# Patient Record
Sex: Male | Born: 1980
Health system: Southern US, Community
[De-identification: ages and names within clinical notes are randomized; demographics above are authoritative.]

## PROBLEM LIST (undated history)

## (undated) DIAGNOSIS — G43909 Migraine, unspecified, not intractable, without status migrainosus: Secondary | ICD-10-CM

## (undated) HISTORY — DX: Migraine, unspecified, not intractable, without status migrainosus: G43.909

## (undated) HISTORY — PX: HERNIA REPAIR: SHX51

---

## 2007-09-13 ENCOUNTER — Ambulatory Visit: Payer: Self-pay | Admitting: Internal Medicine

## 2007-09-14 LAB — CONVERTED CEMR LAB
LDL Cholesterol: 113 mg/dL — ABNORMAL HIGH (ref 0–99)
Total CHOL/HDL Ratio: 4.1

## 2008-03-13 ENCOUNTER — Emergency Department (HOSPITAL_COMMUNITY): Admission: EM | Admit: 2008-03-13 | Discharge: 2008-03-13 | Payer: Self-pay | Admitting: Emergency Medicine

## 2008-03-21 ENCOUNTER — Ambulatory Visit: Payer: Self-pay | Admitting: Family Medicine

## 2009-05-20 ENCOUNTER — Ambulatory Visit: Payer: Self-pay | Admitting: Family Medicine

## 2009-05-20 DIAGNOSIS — J020 Streptococcal pharyngitis: Secondary | ICD-10-CM | POA: Insufficient documentation

## 2009-05-20 DIAGNOSIS — J069 Acute upper respiratory infection, unspecified: Secondary | ICD-10-CM | POA: Insufficient documentation

## 2009-05-20 DIAGNOSIS — R0989 Other specified symptoms and signs involving the circulatory and respiratory systems: Secondary | ICD-10-CM | POA: Insufficient documentation

## 2009-05-20 DIAGNOSIS — R0609 Other forms of dyspnea: Secondary | ICD-10-CM

## 2009-05-20 LAB — CONVERTED CEMR LAB: Rapid Strep: POSITIVE

## 2009-06-12 ENCOUNTER — Ambulatory Visit: Payer: Self-pay | Admitting: Pulmonary Disease

## 2009-09-12 ENCOUNTER — Ambulatory Visit (HOSPITAL_COMMUNITY): Admission: RE | Admit: 2009-09-12 | Discharge: 2009-09-12 | Payer: Self-pay | Admitting: Chiropractic Medicine

## 2009-11-06 ENCOUNTER — Encounter: Payer: Self-pay | Admitting: Pulmonary Disease

## 2009-11-08 ENCOUNTER — Ambulatory Visit: Payer: Self-pay | Admitting: Internal Medicine

## 2009-11-08 ENCOUNTER — Ambulatory Visit: Payer: Self-pay | Admitting: Pulmonary Disease

## 2009-11-08 DIAGNOSIS — G43009 Migraine without aura, not intractable, without status migrainosus: Secondary | ICD-10-CM | POA: Insufficient documentation

## 2009-11-08 DIAGNOSIS — G4733 Obstructive sleep apnea (adult) (pediatric): Secondary | ICD-10-CM | POA: Insufficient documentation

## 2009-11-08 DIAGNOSIS — Z9989 Dependence on other enabling machines and devices: Secondary | ICD-10-CM

## 2010-01-03 ENCOUNTER — Telehealth: Payer: Self-pay | Admitting: Internal Medicine

## 2010-01-13 ENCOUNTER — Ambulatory Visit
Admission: RE | Admit: 2010-01-13 | Discharge: 2010-01-13 | Payer: Self-pay | Source: Home / Self Care | Attending: Family Medicine | Admitting: Family Medicine

## 2010-01-14 ENCOUNTER — Ambulatory Visit: Admit: 2010-01-14 | Payer: Self-pay | Admitting: Family Medicine

## 2010-01-21 ENCOUNTER — Telehealth: Payer: Self-pay | Admitting: Internal Medicine

## 2010-02-06 NOTE — Progress Notes (Signed)
Summary: wants to change to nexium  Phone Note Call from Patient Call back at Home Phone [phone]   Caller: Patient Call For: Claris Gower MD Summary of Call: Pt is asking to change from omeprazole to nexium.  He is saying that would save him money.  Uses  outpatient pharmacy. Initial call taken by: Marty Heck CMA, AAMA,  January 21, 2010 11:53 AM  Follow-up for Phone Call        usually that is more money but I have no objection Okay to send in Rx for nexium 19m daily  1 year refills Follow-up by: RClaris GowerMD,  January 21, 2010 12:46 PM  Additional Follow-up for Phone Call Additional follow up Details #1::        Rx Called In, Spoke with patient and advised results.  Additional Follow-up by: DEdwin DadaCMA (Deborra Medina,  January 21, 2010 4:58 PM    New/Updated Medications: NEXIUM 40 MG CPDR (ESOMEPRAZOLE MAGNESIUM) take 1 by mouth once daily Prescriptions: NEXIUM 40 MG CPDR (ESOMEPRAZOLE MAGNESIUM) take 1 by mouth once daily  #90 x 3   Entered by:   DEdwin DadaCMA ([place]   Authorized by:   RClaris GowerMD   Signed by:   DEdwin DadaCMA ([place] on 01/21/2010   Method used:   Electronically to        [facility] (retail)       [address]       [place]  [address]      Ph: [phone]      Fax: [phone]  RxID:   [id number]

## 2010-02-06 NOTE — Assessment & Plan Note (Signed)
Summary: home sleep study with AHI 5/hr.   Copy to:  [facility] Primary Provider/Referring Provider:  Claris Gower MD   History of Present Illness: The pt underwent an unattended home sleep study with a type 3 device.  The flow and saturation evaluation period was at least 6 hrs and 72mn, and the pt's sleep that night was similar to his usual.  The summary data and tracings have been reviewed with the following findings.  1) the pt was found to have 12 obstructive apneas, 3 central apneas, and 18 obstructive hypopneas.  This gave him an AHI of 5/hr. 2) lowest oxygen desaturation was listed at 67%, however this appears to be a probe error.  The pt spent only 371m the entire night less than or equal to 88%.  Allergies: 1)  ! Bactrim (Sulfamethoxazole-Trimethoprim)   Impression & Recommendations:  Problem # 1:  OBSTRUCTIVE SLEEP APNEA (ICD-327.23)  very mild osa documented by home sleep study. I have discussed study with pt in detail, and explained this is not a health risk for him.  He did not have a lot of impact to his QOL, but is concerned about his snoring disrupting his bed partner's sleep.  I have reviewed with him treatment options including weight loss, surgery, dental appliance, and cpap.  He believes surgery and cpap would be overkill, and I agree.  He will consider whether to try dental appliance, and I have asked him to do some research on internet.  He will let me know if he wishes to pursue options other than weight loss.  Other Orders: Sleep Std Airflow/Heartrate and O2 SAT unattended (95806)

## 2010-02-06 NOTE — Assessment & Plan Note (Signed)
Summary: COUGH   Vital Signs:  Patient profile:   30 year old male Height:      67 inches Weight:      258.25 pounds BMI:     40.59 Temp:     98.3 degrees F oral Pulse rate:   76 / minute Pulse rhythm:   regular BP sitting:   120 / 88  (left arm) Cuff size:   large  Vitals Entered By: Sherrian Divers CMA Deborra Medina) (January 13, 2010 11:46 AM) CC: cough   History of Present Illness: 30 yo here for URI. Had a cold several weeks ago, cough persisted.  Cough is now both productive and dry - can hear rattling. mucous is typically clear, sometimes yellow.   Can hear a little wheezing with deep breath, PMH of bronchitis.  Used his proair inhaler that does help.  No fevers or chills. No other URI symptoms at this point.   Current Medications (verified): 1)  Proair Hfa 108 (90 Base) Mcg/act Aers (Albuterol Sulfate) .... 2 Puffs Up To Every 4 Hours As Needed Wheezing 2)  Omeprazole 40 Mg Cpdr (Omeprazole) .... Take One Daily By Mouth 3)  Relpax 20 Mg Tabs (Eletriptan Hydrobromide) .Marland Kitchen.. 1 At Onset of Migraine. May Repeat in 1- 2 Hours If No Relief 4)  Azithromycin 250 Mg  Tabs (Azithromycin) .... 2 By  Mouth Today and Then 1 Daily For 4 Days  Allergies: 1)  ! Bactrim (Sulfamethoxazole-Trimethoprim)  Past History:  Past Medical History: Last updated: 09/13/2007 Unremarkable  Past Surgical History: Last updated: 06/12/2009 Hernia repair as an  infant  Family History: Last updated: 11/08/2009 Mom has HTN Dad healthy 1 brother CAD in mat GF No DM No prostate or colon cancer Mom and aunt have migraines maternal grandfather with emphysema paternal grandfather and uncles with rheumatism.   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 ENT:  Denies earache, nasal  congestion, sinus pressure, and sore throat. CV:  Denies chest pain or discomfort. Resp:  Complains of cough, sputum productive, and wheezing; denies shortness of breath.  Physical Exam  General:  alert and normal appearance.   VSS Ears:  R ear normal and L ear normal.   Mouth:  no swelling/ lesions or exudate  Lungs:  Normal respiratory effort, chest expands symmetrically. Scant exp wheezes bilaterally. No crackles. Heart:  Normal rate and regular rhythm. S1 and S2 normal without gallop, murmur, click, rub or other extra sounds. Cervical Nodes:  No lymphadenopathy noted Psych:  normal affect, talkative and pleasant    Impression & Recommendations:  Problem # 1:  URI (ICD-465.9) Assessment New Given duration and progression of symptoms, will treat for bacterial bronchitis with Zpack. Proair as needed wheezing/cough.  Complete Medication List: 1)  Proair Hfa 108 (90 Base) Mcg/act Aers (Albuterol sulfate) .... 2 puffs up to every 4 hours as needed wheezing 2)  Omeprazole 40 Mg Cpdr (Omeprazole) .... Take one daily by mouth 3)  Relpax 20 Mg Tabs (Eletriptan hydrobromide) .Marland Kitchen.. 1 at onset of migraine. may repeat in 1- 2 hours if no relief 4)  Azithromycin 250 Mg Tabs (Azithromycin) .... 2 by  mouth today and then 1 daily for 4 days  Patient Instructions: 1)  Take antibiotic as directed.  Drink lots of fluids.  Treat sympotmatically with  Mucinex, nasal saline irrigation, and Tylenol/Ibuprofen. ough suppressant at night. Call if not improving as expected in 5-7 days.  Prescriptions: PROAIR HFA 108 (90 BASE) MCG/ACT AERS (ALBUTEROL SULFATE) 2 puffs up to every 4 hours as needed wheezing  #1 mdi x 0   Entered and Authorized by:   Arnette Norris MD   Signed by:   Arnette Norris MD on 01/13/2010   Method used:   [facility]        [facility] [place] [facility]       [place], [place]  [address]       Ph: [phone] or [phone]       Fax: [phone]    RxID:   [id number] AZITHROMYCIN 250 MG  TABS (AZITHROMYCIN) 2 by  mouth today and then 1 daily for 4 days  #6 x 0   Entered and Authorized by:   Arnette Norris MD   Signed by:   Arnette Norris MD on 01/13/2010   Method used:   [facility]        [facility] [facility]       [address]       [place], [place]  [address]       Ph: [phone] or [phone]       Fax: [phone]   RxID:   [id number]    Orders Added: 1)  Est. Patient Level III [73668]    Current Allergies (reviewed today): ! BACTRIM (SULFAMETHOXAZOLE-TRIMETHOPRIM)

## 2010-02-06 NOTE — Progress Notes (Signed)
Summary: wants to try relpax   Phone Note Call from Patient Call back at Home Phone [phone]   Caller: Patient Call For: Claris Gower MD Summary of Call: Patient has been taking imitrex for his migraines, but he says he doesn't like the way he feels when taking it He said the only way to describe it is that his throat feels like it has a balloon expanding inside of it. He is asking if he can try relpax.  [facility]  Initial call taken by: Lacretia Nicks,  January 03, 2010 11:55 AM  Follow-up for Phone Call        okay to try relpax may be a fair bit of money though  relpax 72m 1 at onset of migraine. May repeat in 1- 2 hours if no relief #10 x 1 Follow-up by: RClaris GowerMD,  January 03, 2010 1:28 PM  Additional Follow-up for Phone Call Additional follow up Details #1::        left message on machine at work, advised pt to call if any problems Additional Follow-up by: DEdwin DadaCMA (Deborra Medina,  January 03, 2010 2:53 PM    New/Updated Medications: RELPAX 20 MG TABS (ELETRIPTAN HYDROBROMIDE) 1 at onset of migraine. May repeat in 1- 2 hours if no relief Prescriptions: RELPAX 20 MG TABS (ELETRIPTAN HYDROBROMIDE) 1 at onset of migraine. May repeat in 1- 2 hours if no relief  #10 x 1   Entered by:   DEdwin DadaCMA ([place]   Authorized by:   RClaris GowerMD   Signed by:   DEdwin DadaCMA ([place] on 01/03/2010   Method used:   [facility]        [facility] [facility]       [address]      [place]  [address]      Ph: [phone]      Fax: [phone]  RxID:   [id number]

## 2010-02-06 NOTE — Assessment & Plan Note (Signed)
Summary: consult for possible osa   Copy to:  [facility] Primary Provider/Referring Provider:  Claris Gower MD  CC:  Sleep Consult.  History of Present Illness: the patient is a 30 year old male who I have been asked to see for possible obstructive sleep apnea. He has been noted to have loud snoring, but his wife has never complained of definite pauses in his breathing during sleep. He has had snoring arousals. He typically goes to bed between 12 and 1 AM, and arises at 9 AM to start his day. He has frequent awakenings at night for unknown reasons, and often is not rested in the mornings upon arising. He also notes morning headaches, and occasionally will awaken at night with a headache. The patient works as a Engineer, site, and denies inappropriate daytime sleepiness. However, he admits that his alertness is not at what he would consider a normal level. The patient denies any napping during the day, and actually gets his second wind in the evening. He has no difficulties watching TV or reading. He denies any sleepiness with driving. His weight is up about 20 pounds over the last 2 years, and his Epworth score today is borderline at 10  Current Medications (verified): 1)  Proair Hfa 108 (90 Base) Mcg/act Aers (Albuterol Sulfate) .... 2 Puffs Up To Every 4 Hours As Needed Wheezing 2)  Omeprazole 40 Mg Cpdr (Omeprazole) .... Take One Daily By Mouth  Allergies (verified): 1)  ! Bactrim (Sulfamethoxazole-Trimethoprim)  Past History:  Past Medical History: Reviewed history from 09/13/2007 and no changes required. Unremarkable  Past Surgical History: Hernia repair as an  infant  Family History: Reviewed history from 09/13/2007 and no changes required. Mom has HTN Dad healthy 1 brother CAD in mat GF No DM No prostate or colon cancer maternal grandfather with emphysema paternal grandfather and uncles with rheumatism.   Social History: Reviewed history from 09/13/2007 and no  changes required. 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  The patient denies shortness of breath with activity, shortness of breath at rest, productive cough, coughing up blood, chest pain, irregular heartbeats, loss of appetite, weight change, abdominal pain, difficulty swallowing, tooth/dental problems, sneezing, itching, ear ache, anxiety, depression, hand/feet swelling, rash, change in color of mucus, and fever.    Vital Signs:  Patient profile:   30 year old male Height:      67 inches Weight:      246 pounds BMI:     38.67 O2 Sat:      100 % on Room air Temp:     98.3 degrees F oral Pulse rate:   78 / minute BP sitting:   138 / 88  (right arm) Cuff size:   large  Vitals Entered By: Matthew Folks LPN (June 13, 8655 8:46 PM)  O2 Flow:  Room air CC: Sleep Consult Comments Medications reviewed with patient Matthew Folks LPN  June 13, 9627 5:28 PM    Physical Exam  General:  obese male in nad Eyes:  PERRLA and EOMI.   Nose:  mild septal deviation to right Mouth:  elongation of uvula, normal soft palate, narrowing of posterior pharyngeal space Neck:  no jvd, tmg, LN Lungs:  clear to auscultation Heart:  rrr, no  mrg Abdomen:  soft and nontender, bs+ Extremities:  no edema or cyanosis pulses intact distally Neurologic:  alert and oriented, moves all 4.   Impression & Recommendations:  Problem # 1:  SNORING (ICD-786.09) the pt's history is most suggestive of symptomatic snoring vs. mild sleep apnea.  There is really no way to tell without a sleep study.  He is young, has no major health issues, and has questionable sleep disordered breathing.  He is a good candidate for home sleep testing, and is  willing to proceed with this. I have discussed the spectrum of sleep disordered breathing with him, including asymptomatic snoring, symptomatic snoring, UARS, and finally osa.   I have encouraged him to work hard on weight loss, and suspect his snoring will greatly improve if he can get to ideal body weight.  Will call with the results of the study once available.  Other Orders: Consultation Level IV (16945) Misc. Referral (Misc. Ref)  Patient Instructions: 1)  will do home sleep study to evaluate for sleep apnea 2)  work on weight loss 3)  will call when results are available.

## 2010-02-06 NOTE — Assessment & Plan Note (Signed)
Summary: ST,FEVER/CLE   Vital Signs:  Patient profile:   30 year old male Height:      67 inches Weight:      250.25 pounds BMI:     39.34 Temp:     99.3 degrees F oral Pulse rate:   88 / minute Pulse rhythm:   regular BP sitting:   128 / 76  (left arm) Cuff size:   large  Vitals Entered By: Ozzie Hoyle LPN (May 20, 5398 86:76 PM) CC: fever sorethroat   History of Present Illness: here with strep throat today  started symptoms -- late sat night felt tickle in throat and then chills and then fever and achey  throat is really sore now -- that really started this am  no rash or joint change  no particular exposure he is prone to strep   no n/v/d some drainage and congestion-- in sinuses  not a lot of cough   is interested in sleep clinic eval  very lound snorer more headaches in am recently  is tired sometimes gets fair amt of sleep    Allergies: 1)  ! 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 Eyes:  Denies blurring and eye irritation. CV:  Denies chest pain or discomfort, lightheadness, palpitations, and shortness of breath with exertion. Resp:  Complains of cough and excessive snoring; denies sputum productive and wheezing. GI:  Denies abdominal pain, diarrhea, nausea, and vomiting. MS:  Complains of joint pain and muscle aches. Derm:  Denies itching, lesion(s), poor wound healing, and  rash. Neuro:  Denies headaches. Heme:  Denies abnormal bruising and bleeding.  Physical Exam  General:  overweight but generally well appearing  Head:  normocephalic, atraumatic, and no abnormalities observed.  no sinus or temporal tenderness Eyes:  vision grossly intact, pupils equal, pupils round, pupils reactive to light, and no injection.   Ears:  R ear normal and L ear normal.   Nose:  nares are injected and congested bilaterally  Mouth:  throat diffusely bright erythema no swelling/ lesions or exudate  Neck:  No deformities, masses, or tenderness noted. Lungs:  Normal respiratory effort, chest expands symmetrically. Lungs are clear to auscultation, no crackles or wheezes. Heart:  Normal rate and regular rhythm. S1 and S2 normal without gallop, murmur, click, rub or other extra sounds. Abdomen:  soft and non-tender.   Msk:  no acute joint changes  Extremities:  No clubbing, cyanosis, edema, or deformity noted with normal full range of motion of all joints.   Neurologic:  sensation intact to light touch, gait normal, and DTRs symmetrical and normal.   Skin:  Intact without suspicious lesions or rashes Cervical Nodes:  No lymphadenopathy noted Psych:  normal affect, talkative and pleasant    Impression & Recommendations:  Problem # 1:  STREP THROAT (ICD-034.0) Assessment New with fever and st tx with amox and update fluids/ rest / fever control  The following medications were removed from the medication list:    Zithromax Z-pak 250 Mg Tabs (Azithromycin) .Marland KitchenMarland KitchenMarland KitchenMarland Kitchen  Take by mouth as directed His updated medication list for this problem includes:    Amoxicillin 500 Mg Caps (Amoxicillin) .Marland Kitchen... 1 by mouth three times a day for 10 days for strep  Problem # 2:  URI (ICD-465.9) Assessment: New with nasal congestion and dry cough- viral  recommend sympt care- see pt instructions  pt advised to update me if symptoms worsen or do not improve - esp fever or sinus pain His updated  medication list for this problem includes:    Tussionex Pennkinetic Er 8-10 Mg/56m Lqcr (Chlorpheniramine-hydrocodone) ..Marland Kitchen.. 1/2 to 1 tsp by mouth up to two times a day as needed for cough  Problem # 3:  SNORING (ICD-786.09) Assessment: New  very loud snoring- tiredness occas and worsening am headaches suspect apnea ref to sleep clinic for this expl imp of wt loss  His updated medication list for this problem includes:    Proair Hfa 108 (90 Base) Mcg/act Aers (Albuterol sulfate) ..Marland Kitchen.. 2 puffs up to every 4 hours as needed wheezing  Orders: Sleep Disorder Referral (Sleep Disorder)  Complete Medication List: 1)  Tussionex Pennkinetic Er 8-10 Mg/567mLqcr (Chlorpheniramine-hydrocodone) .... 1/2 to 1 tsp by mouth up to two times a day as needed for cough 2)  Proair Hfa 108 (90 Base) Mcg/act Aers (Albuterol sulfate) .... 2 puffs up to every 4 hours as needed wheezing 3)  Omeprazole 40 Mg Cpdr (Omeprazole) .... Take one daily by mouth 4)  Amoxicillin 500 Mg Caps (Amoxicillin) ...Marland Kitchen 1 by mouth three times a day for 10 days for strep  Patient Instructions: 1)  take the amoxicillin for strep - I sent to your pharmacy  2)  you can try mucinex over the counter twice daily as directed and nasal saline spray for congestion 3)  tylenol over the counter as directed may help with aches, headache and fever 4)  call if symptoms worsen or if not improved in 4-5 days  5)  we will do a sleep clinic referral at check office  Prescriptions: AMOXICILLIN 500 MG CAPS (AMOXICILLIN) 1 by mouth three times a day for 10 days for strep  #30 x 0   Entered and Authorized by:   MaAllena EaringD   Signed by:   MaAllena EaringD on 05/20/2009   Method used:   [facility]        [facility] [facility]       [address]     [place]  [address]     Ph: [phone]     Fax: [phone] RxID:   [id number] Current Allergies (reviewed today): ! BACTRIM  (SULFAMETHOXAZOLE-TRIMETHOPRIM)  Laboratory Results  Date/Time Received: May 20, 2009 12:42 PM  Date/Time Reported: May 20, 2009 12:42 PM   Other Tests  Rapid Strep: positive  Kit Test Internal QC: Positive   (Normal Range: Negative)

## 2010-02-06 NOTE — Assessment & Plan Note (Signed)
Summary: migraines/alc   Vital Signs:  Patient profile:   30 year old male Weight:      252 pounds Temp:     98.5 degrees F oral Pulse rate:   60 / minute Pulse rhythm:   regular BP sitting:   128 / 98  (left arm) Cuff size:   large  Vitals Entered By: Edwin Dada CMA Deborra Medina) (November 08, 2009 11:33 AM) CC: migraines   History of Present Illness: Has occ migraines More in the past year First started  ~ age 40  Gets very nauseated --esp with noises or light generally awakens with mild symptoms--then disabled within an hour  Has had up to once a month has tried excedrin migraine or ibuprofen Not too effective  Has phenergan but this doesn't help this nausea much  Allergies: 1)  ! Bactrim (Sulfamethoxazole-Trimethoprim)  Past History:  Past medical, surgical, family and social histories (including risk factors) reviewed for relevance to current acute and chronic problems.  Past Medical History: Reviewed history from 09/13/2007 and no changes required. Unremarkable  Past Surgical History: Reviewed history from 06/12/2009 and no changes required. Hernia repair as an  infant  Family History: Mom has HTN Dad healthy 1 brother CAD in mat GF No DM No prostate or colon cancer Mom and aunt have migraines maternal grandfather with emphysema paternal grandfather and uncles with rheumatism.   Social History: Reviewed history from 06/12/2009 and no changes required. 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     Impression & Recommendations:  Problem # 1:  COMMON MIGRAINE (ICD-346.10) Assessment Comment Only  has had for at least 10 years but more difficult to control and more freq still only once a month so prophylaxis not appropriate  will give Rx for  sumatriptan to use as needed   His updated medication list for this problem includes:    Sumatriptan Succinate 100 Mg Tabs (Sumatriptan succinate) .Marland Kitchen... 1/2 - 1 tab at onset of migraine. repeat about 30 minutes later as needed  Complete Medication List: 1)  Proair Hfa 108 (90 Base) Mcg/act Aers (Albuterol sulfate) .... 2 puffs up to every 4 hours as needed wheezing 2)  Omeprazole 40 Mg Cpdr (Omeprazole) .... Take one daily by mouth 3)  Sumatriptan Succinate 100 Mg Tabs (Sumatriptan succinate) .... 1/2 - 1 tab at onset of migraine. repeat about 30 minutes later as needed  Patient Instructions: 1)  Please schedule a follow-up appointment in 1 year for physical Prescriptions: SUMATRIPTAN SUCCINATE 100 MG TABS (SUMATRIPTAN SUCCINATE) 1/2 - 1 tab at onset of migraine. Repeat about 30 minutes later as needed  #15 x 1   Entered and Authorized by:   Claris Gower MD   Signed by:   Claris Gower MD on 11/08/2009   Method used:   Electronically to        [facility] (retail)       [address], [place]  [address]       Ph: [phone]       Fax: [phone]   RxID:   [id number] OMEPRAZOLE 40 MG CPDR (OMEPRAZOLE) take one daily by mouth  #90 x 3  Entered by:   Edwin Dada CMA ([place])   Authorized by:   Claris Gower MD   Signed by:   Edwin Dada CMA ([place]) on 11/08/2009   Method used:   Electronically to        [facility] (retail)       [address]       [place], [place]  [address]       Ph: [phone]       Fax: [phone]   RxID:   [id number]    Orders Added: 1)  Est. Patient Level III [12248]    Current Allergies (reviewed today): ! BACTRIM (SULFAMETHOXAZOLE-TRIMETHOPRIM)

## 2010-02-24 ENCOUNTER — Encounter: Payer: Self-pay | Admitting: Internal Medicine

## 2010-03-13 NOTE — Letter (Signed)
Summary: External Correspondence  External Correspondence   Imported By: Laural Benes 03/03/2010 13:00:03  _____________________________________________________________________  External Attachment:    Type:   Image     Comment:   External Document  Appended Document: External Correspondence Migraine without aura trying maxalt

## 2010-04-17 LAB — POCT RAPID STREP A (OFFICE): Streptococcus, Group A Screen (Direct): NEGATIVE

## 2010-11-10 ENCOUNTER — Encounter: Payer: Self-pay | Admitting: Internal Medicine

## 2010-11-10 ENCOUNTER — Ambulatory Visit (INDEPENDENT_AMBULATORY_CARE_PROVIDER_SITE_OTHER): Payer: 59 | Admitting: Internal Medicine

## 2010-11-10 VITALS — BP 141/90 | HR 70 | Temp 98.0°F | Wt 239.0 lb

## 2010-11-10 DIAGNOSIS — J069 Acute upper respiratory infection, unspecified: Secondary | ICD-10-CM

## 2010-11-10 MED ORDER — AMOXICILLIN 500 MG PO TABS: 1000.0000 mg | ORAL_TABLET | Freq: Two times a day (BID) | ORAL | Status: AC

## 2010-11-10 MED ORDER — TRAMADOL HCL 50 MG PO TABS: 50.0000 mg | ORAL_TABLET | Freq: Every evening | ORAL | Status: AC | PRN

## 2010-11-10 NOTE — Assessment & Plan Note (Signed)
Seems to have viral infection with post infectious cough Will try tramadol for bedtime cough Amoxicillin if worsens

## 2010-11-10 NOTE — Progress Notes (Signed)
  Subjective:    Patient ID: Adam Dunn, male    DOB: Jun 18, 1980, 30 y.o.   MRN: [id number]  HPI Had a cold with the change in weather--started about 9 days ago Now with persistent dry cough after period of feeling better  No SOB No fever Very little head congestion or drainage No sore throat or ear pain  Tried delsym---helps a little Uses cough drops in day  No current outpatient prescriptions on file prior to visit.    Allergies  Allergen Reactions  . Sulfamethoxazole W/Trimethoprim     REACTION: Pt does not remember, was an infant    No past medical history on file.  Past Surgical History  Procedure Date  . Hernia repair     infant    Family History  Problem Relation Age of Onset  . Hypertension Mother   . Migraines Mother   . Healthy Father   . Coronary artery disease Maternal Grandfather   . Emphysema Maternal Grandfather   . Colon cancer Neg Hx   . Prostate cancer Neg Hx     History   Social History  . Marital Status: Married    Spouse Name: N/A    Number of Children: 1  . Years of Education: N/A   Occupational History  . RETAILER    Social History Main Topics  . Smoking status: Never Smoker   . Smokeless tobacco: Never Used  . Alcohol Use: Yes  . Drug Use: No  . Sexually Active: Not on file   Other Topics Concern  . Not on file   Social History Narrative  . No narrative on file   Review of Systems No N/V or diarrhea No problems with diarrhea No rash    Objective:   Physical Exam  Constitutional: He appears well-developed and well-nourished. No distress.       clammy  HENT:  Right Ear: External ear normal.  Left Ear: External ear normal.  Mouth/Throat: Oropharynx is clear and moist. No oropharyngeal exudate.       Mild nasal congestion on right No sinus tenderness  Neck: Normal range of motion. Neck supple. No thyromegaly present.  Pulmonary/Chest: Effort normal and breath sounds normal. No respiratory distress. He has no  wheezes. He has no rales.  Lymphadenopathy:    He has no cervical adenopathy.          Assessment & Plan:

## 2010-12-22 ENCOUNTER — Other Ambulatory Visit: Payer: Self-pay | Admitting: Internal Medicine

## 2011-08-03 ENCOUNTER — Other Ambulatory Visit: Payer: Self-pay | Admitting: Internal Medicine

## 2012-02-23 ENCOUNTER — Telehealth: Payer: Self-pay | Admitting: Family Medicine

## 2012-02-23 NOTE — Telephone Encounter (Signed)
Pt would like to see Dr. Leta Baptist, [facility], and needs referral.

## 2012-02-24 NOTE — Telephone Encounter (Signed)
Left detailed message on VM with results

## 2012-02-24 NOTE — Telephone Encounter (Signed)
If he does have fibromyalgia, a rheumatologist would be a better choice of specialist.  Most people with typical migraines can be treated with available meds without a specialist (and there are headache specialists for those who have complex problems)  I really recommend a visit her first to review all that is going on, then decide on a plan

## 2012-02-24 NOTE — Telephone Encounter (Signed)
Patient states he has migraines and maybe on the beginning stages of fibromyalgia, per pt states his wife works at [employer] and suggested this neurologist.

## 2012-02-24 NOTE — Telephone Encounter (Signed)
.  left message to have patient return my call, need more information as to why he needs the referral

## 2012-02-24 NOTE — Telephone Encounter (Signed)
Please get more information Why does he want to see a neurologist?

## 2012-06-30 ENCOUNTER — Telehealth: Payer: Self-pay | Admitting: Internal Medicine

## 2012-06-30 NOTE — Telephone Encounter (Signed)
A neurologist may be appropriate for headaches but not "fibromyalgia" I have not seen him for years, if he would like to discuss a referral, he should have appt here first

## 2012-06-30 NOTE — Telephone Encounter (Signed)
Pt says you suggested referring him to a RA dr for his fibromyalgia pain and headaches.  He says he would prefer to be referred to a neurologist and wants to know if you would refer him there first? Thank you.

## 2012-07-01 NOTE — Telephone Encounter (Signed)
Left message on phone with results, advised pt to call for appt

## 2013-02-02 ENCOUNTER — Ambulatory Visit (INDEPENDENT_AMBULATORY_CARE_PROVIDER_SITE_OTHER): Payer: 59 | Admitting: Internal Medicine

## 2013-02-02 ENCOUNTER — Encounter: Payer: Self-pay | Admitting: Internal Medicine

## 2013-02-02 VITALS — BP 118/78 | HR 66 | Temp 98.4°F | Wt 188.0 lb

## 2013-02-02 DIAGNOSIS — R071 Chest pain on breathing: Secondary | ICD-10-CM

## 2013-02-02 DIAGNOSIS — R0789 Other chest pain: Secondary | ICD-10-CM

## 2013-02-02 NOTE — Patient Instructions (Signed)
Try ibuprofen 219m ---   2-3 tabs up to three times a day with food You can also try heat on the tender area

## 2013-02-02 NOTE — Progress Notes (Signed)
   Subjective:    Patient ID: Adam Dunn, male    DOB: [birth year], 33 y.o.   MRN: [id number]  HPI Hasn't been here in a while  Has had some pain under his lower left ribs Thought he may have aggravated it while moving--boxes, etc Has taken it easy and it still hurts--or is even worse Started about 1 week ago Worse with any movement No pain just with breathing  Has mild cold-- cough has aggravated it Dry cough No fever No SOB  No nausea or vomiting  Appetite is fine  bowels are normal  Current Outpatient Prescriptions on File Prior to Visit  Medication Sig Dispense Refill  . RELPAX 20 MG tablet TAKE ONE TABLET BY MOUTH AT ONSET OF HEADACHE, MAY REPEAT IN 1-2 HOURS IF NO RELIEF  8 tablet  1   No current facility-administered medications on file prior to visit.    Allergies  Allergen Reactions  . Sulfamethoxazole-Trimethoprim     REACTION: Pt does not remember, was an infant    No past medical history on file.  Past Surgical History  Procedure Laterality Date  . Hernia repair      infant    Family History  Problem Relation Age of Onset  . Hypertension Mother   . Migraines Mother   . Healthy Father   . Coronary artery disease Maternal Grandfather   . Emphysema Maternal Grandfather   . Colon cancer Neg Hx   . Prostate cancer Neg Hx     History   Social History  . Marital Status: Married    Spouse Name: N/A    Number of Children: 2  . Years of Education: N/A   Occupational History  . Realtor    Social History Main Topics  . Smoking status: Never Smoker   . Smokeless tobacco: Never Used  . Alcohol Use: Yes  . Drug Use: No  . Sexual Activity: Not on file   Other Topics Concern  . Not on file   Social History Narrative  . No narrative on file   Review of Systems Has lost about 40#-- fairly recently Did [place] healthy weight loss    Objective:   Physical Exam  Constitutional: He appears well-developed and well-nourished. No distress.    Neck: Normal range of motion. Neck supple. No thyromegaly present.  Cardiovascular: Normal rate, regular rhythm and normal heart sounds.  Exam reveals no gallop.   No murmur heard. Pulmonary/Chest: Effort normal and breath sounds normal. No respiratory distress. He has no wheezes. He has no rales. He exhibits tenderness.  Tenderness along left T11 from costal margin to mid axilla No bony abnormality  No dullness to percussion  Abdominal: Soft. Bowel sounds are normal. He exhibits no distension and no mass. There is no tenderness. There is no rebound and no guarding.  No HSM  Musculoskeletal: He exhibits no edema.  Lymphadenopathy:    He has no cervical adenopathy.  Skin: No rash noted.  Psychiatric: He has a normal mood and affect. His behavior is normal.          Assessment & Plan:

## 2013-02-02 NOTE — Progress Notes (Signed)
Pre-visit discussion using our clinic review tool. No additional management support is needed unless otherwise documented below in the visit note.  

## 2013-02-02 NOTE — Assessment & Plan Note (Signed)
No signs that this is pleurisy--lungs clear and not sick like pneumonia No GI symptoms  Reassured -- seems to be strain Discussed heat and ibuprofen

## 2013-03-11 ENCOUNTER — Emergency Department
Admission: EM | Admit: 2013-03-11 | Discharge: 2013-03-11 | Disposition: A | Discharge status: Home / Self Care | Payer: 59 | Source: Home / Self Care | Attending: Family Medicine | Admitting: Family Medicine

## 2013-03-11 ENCOUNTER — Encounter: Payer: Self-pay | Admitting: Emergency Medicine

## 2013-03-11 DIAGNOSIS — R69 Illness, unspecified: Principal | ICD-10-CM

## 2013-03-11 DIAGNOSIS — R509 Fever, unspecified: Secondary | ICD-10-CM

## 2013-03-11 DIAGNOSIS — J029 Acute pharyngitis, unspecified: Secondary | ICD-10-CM

## 2013-03-11 DIAGNOSIS — J111 Influenza due to unidentified influenza virus with other respiratory manifestations: Secondary | ICD-10-CM

## 2013-03-11 LAB — POCT RAPID STREP A (OFFICE): RAPID STREP A SCREEN: NEGATIVE

## 2013-03-11 MED ORDER — BENZONATATE 200 MG PO CAPS: 200.0000 mg | ORAL_CAPSULE | Freq: Every day | ORAL | Status: DC

## 2013-03-11 MED ORDER — OSELTAMIVIR PHOSPHATE 75 MG PO CAPS: 75.0000 mg | ORAL_CAPSULE | Freq: Two times a day (BID) | ORAL | Status: DC

## 2013-03-11 NOTE — ED Notes (Signed)
C/o fever, chills, body aches, and sore throat x 2 days. Patient states has tried OTC Ibuprofen, Chloraseptic, and Delsym without relief.

## 2013-03-11 NOTE — Discharge Instructions (Signed)
Take plain Mucinex (1200 mg guaifenesin) twice daily for cough and congestion.  May add Sudafed for sinus congestion.   Increase fluid intake, rest. May use Afrin nasal spray (or generic oxymetazoline) twice daily for about 5 days.  Also recommend using saline nasal spray several times daily and saline nasal irrigation (AYR is a common brand) Try warm salt water gargles for sore throat.  Stop all antihistamines for now, and other non-prescription cough/cold preparations. May take Ibuprofen 248m, 4 tabs every 8 hours with food for chest/sternum discomfort, body aches, headache, etc.  Salt Water Gargle This solution will help make your mouth and throat feel better. HOME CARE INSTRUCTIONS   Mix 1 teaspoon of salt in 8 ounces of warm water.  Gargle with this solution as much or often as you need or as directed. Swish and gargle gently if you have any sores or wounds in your mouth.  Do not swallow this mixture. Document Released: 09/26/2003 Document Revised: 03/16/2011 Document Reviewed: 02/17/2008 [facility] LtdPatient Information 2014 [place]   Influenza, Adult Influenza ("the flu") is a viral infection of the respiratory tract. It occurs more often in winter months because people spend more time in close contact with one another. Influenza can make you feel very sick. Influenza easily spreads from person to person (contagious). CAUSES  Influenza is caused by a virus that infects the respiratory tract. You can catch the virus by breathing in droplets from an infected person's cough or sneeze. You can also catch the virus by touching something that was recently contaminated with the virus and then touching your mouth, nose, or eyes. SYMPTOMS  Symptoms typically last 4 to 10 days and may include:  Fever.  Chills.  Headache, body aches, and muscle aches.  Sore throat.  Chest discomfort and cough.  Poor appetite.  Weakness or feeling tired.  Dizziness.  Nausea or  vomiting. DIAGNOSIS  Diagnosis of influenza is often made based on your history and a physical exam. A nose or throat swab test can be done to confirm the diagnosis. RISKS AND COMPLICATIONS You may be at risk for a more severe case of influenza if you smoke cigarettes, have diabetes, have chronic heart disease (such as heart failure) or lung disease (such as asthma), or if you have a weakened immune system. Elderly people and pregnant women are also at risk for more serious infections. The most common complication of influenza is a lung infection (pneumonia). Sometimes, this complication can require emergency medical care and may be life-threatening. PREVENTION  An annual influenza vaccination (flu shot) is the best way to avoid getting influenza. An annual flu shot is now routinely recommended for all adults in the [place] TREATMENT  In mild cases, influenza goes away on its own. Treatment is directed at relieving symptoms. For more severe cases, your caregiver may prescribe antiviral medicines to shorten the sickness. Antibiotic medicines are not effective, because the infection is caused by a virus, not by bacteria. HOME CARE INSTRUCTIONS  Only take over-the-counter or prescription medicines for pain, discomfort, or fever as directed by your caregiver.  Use a cool mist humidifier to make breathing easier.  Get plenty of rest until your temperature returns to normal. This usually takes 3 to 4 days.  Drink enough fluids to keep your urine clear or pale yellow.  Cover your mouth and nose when coughing or sneezing, and wash your hands well to avoid spreading the virus.  Stay home from work or school until your fever has been  gone for at least 1 full day. SEEK MEDICAL CARE IF:   You have chest pain or a deep cough that worsens or produces more mucus.  You have nausea, vomiting, or diarrhea. SEEK IMMEDIATE MEDICAL CARE IF:   You have difficulty breathing, shortness of breath, or your skin or  nails turn bluish.  You have severe neck pain or stiffness.  You have a severe headache, facial pain, or earache.  You have a worsening or recurring fever.  You have nausea or vomiting that cannot be controlled. MAKE SURE YOU:  Understand these instructions.  Will watch your condition.  Will get help right away if you are not doing well or get worse. Document Released: 12/20/1999 Document Revised: 06/23/2011 Document Reviewed: 03/23/2011 [facility] Patient Information 2014 [place], [place].

## 2013-03-11 NOTE — ED Provider Notes (Signed)
CSN: [id number]     Arrival date & time 03/11/13  1736 History   First MD Initiated Contact with Patient 03/11/13 1815     Chief Complaint  Patient presents with  . Fever  . Sore Throat  . Generalized Body Aches      HPI Comments: Patient began developing myalgias, scratchy throat, fatigue, and headache two days ago.  Yesterday he developed productive cough, sinus congestion, nausea (without vomiting), and fever to 103  The history is provided by the patient.    History reviewed. No pertinent past medical history. Past Surgical History  Procedure Laterality Date  . Hernia repair      infant   Family History  Problem Relation Age of Onset  . Hypertension Mother   . Migraines Mother   . Healthy Father   . Coronary artery disease Maternal Grandfather   . Emphysema Maternal Grandfather   . Colon cancer Neg Hx   . Prostate cancer Neg Hx    History  Substance Use Topics  . Smoking status: Never Smoker   . Smokeless tobacco: Never Used  . Alcohol Use: Yes    Review of Systems + sore throat + cough + hoarseness No pleuritic pain No wheezing + nasal congestion + post-nasal drainage No sinus pain/pressure No itchy/red eyes No earache No hemoptysis No SOB + fever, + chills + nausea No vomiting No abdominal pain No diarrhea No urinary symptoms No skin rash + fatigue + myalgias + headache Used OTC meds without relief  Allergies  Sulfamethoxazole-trimethoprim  Home Medications   Current Outpatient Rx  Name  Route  Sig  Dispense  Refill  . benzonatate (TESSALON) 200 MG capsule   Oral   Take 1 capsule (200 mg total) by mouth at bedtime. Take as needed for cough   12 capsule   0   . oseltamivir (TAMIFLU) 75 MG capsule   Oral   Take 1 capsule (75 mg total) by mouth every 12 (twelve) hours.   10 capsule   0   . RELPAX 20 MG tablet      TAKE ONE TABLET BY MOUTH AT ONSET OF HEADACHE, MAY REPEAT IN 1-2 HOURS IF NO RELIEF   8 tablet   1    BP 119/86   Pulse 77  Temp(Src) 98.2 F (36.8 C) (Oral)  Resp 16  Ht 5' 6"  (1.676 m)  Wt 200 lb (90.719 kg)  BMI 32.30 kg/m2  SpO2 98% Physical Exam Nursing notes and Vital Signs reviewed. Appearance:  Patient appears stated age, and in no acute distress.  Patient is obese (BMI 32.3) Eyes:  Pupils are equal, round, and reactive to light and accomodation.  Extraocular movement is intact.  Conjunctivae are not inflamed  Ears:  Canals normal.  Tympanic membranes normal.  Nose:  Mildly congested turbinates.  No sinus tenderness.   Pharynx:  Minimal erythema Neck:  Supple. No adenopathy Lungs:  Clear to auscultation.  Breath sounds are equal.  Heart:  Regular rate and rhythm without murmurs, rubs, or gallops.  Abdomen:  Nontender without masses or hepatosplenomegaly.  Bowel sounds are present.  No CVA or flank tenderness.  Extremities:  No edema.  No calf tenderness Skin:  No rash present.   ED Course  Procedures  none    Labs Reviewed  STREP A DNA PROBE  POCT RAPID STREP A (OFFICE) negative        MDM   1. Influenza-like illness   2. Acute pharyngitis    Throat  culture pending. Begin Tamiflu.  Prescription written for Benzonatate [facility]) to take at bedtime for night-time cough.  Take plain Mucinex (1200 mg guaifenesin) twice daily for cough and congestion.  May add Sudafed for sinus congestion.   Increase fluid intake, rest. May use Afrin nasal spray (or generic oxymetazoline) twice daily for about 5 days.  Also recommend using saline nasal spray several times daily and saline nasal irrigation (AYR is a common brand) Try warm salt water gargles for sore throat.  Stop all antihistamines for now, and other non-prescription cough/cold preparations. May take Ibuprofen 241m, 4 tabs every 8 hours with food for chest/sternum discomfort, body aches, headache, etc. Followup with Family Doctor if not improved in one week.     SKandra Nicolas MD 03/12/13 [phone]

## 2013-03-13 LAB — STREP A DNA PROBE: GASP: NEGATIVE

## 2013-03-14 ENCOUNTER — Encounter: Payer: Self-pay | Admitting: Internal Medicine

## 2013-03-14 ENCOUNTER — Ambulatory Visit (INDEPENDENT_AMBULATORY_CARE_PROVIDER_SITE_OTHER): Payer: 59 | Admitting: Internal Medicine

## 2013-03-14 VITALS — BP 110/78 | HR 74 | Temp 98.6°F | Resp 12 | Wt 200.2 lb

## 2013-03-14 DIAGNOSIS — J019 Acute sinusitis, unspecified: Secondary | ICD-10-CM | POA: Insufficient documentation

## 2013-03-14 MED ORDER — AMOXICILLIN 500 MG PO TABS: 1000.0000 mg | ORAL_TABLET | Freq: Two times a day (BID) | ORAL | Status: DC

## 2013-03-14 MED ORDER — HYDROCODONE-HOMATROPINE 5-1.5 MG/5ML PO SYRP
5.0000 mL | ORAL_SOLUTION | Freq: Every evening | ORAL | Status: DC | PRN
Start: 2013-03-14 — End: 2013-03-29

## 2013-03-14 NOTE — Progress Notes (Signed)
Pre visit review using our clinic review tool, if applicable. No additional management support is needed unless otherwise documented below in the visit note. 

## 2013-03-14 NOTE — Assessment & Plan Note (Signed)
Seems like a bacterial secondary infection Will give amoxicillin and cough syrup

## 2013-03-14 NOTE — Progress Notes (Signed)
   Subjective:    Patient ID: Adam Dunn, male    DOB: 1980/05/25, 33 y.o.   MRN: [id number]  HPI Did get over last illness---cough finally resolved  Started again 5 days ago Going around household Hit him hard right away [place] to walk in in [place]--- started on tamiflu and benzonatate Not helping much  Has had some fever over the past few days Taking ibuprofen every 4 hours (435m) Sore throat and cough have continued Cough keeping him up at night Cough is worse now--- productive of green mucus (he can feel PND) Not much sinus pressure but does have congestion  Taking shallow breaths but not SOB No ear pain  Still on tamiflu Tried chloraseptic spray also  Current Outpatient Prescriptions on File Prior to Visit  Medication Sig Dispense Refill  . benzonatate (TESSALON) 200 MG capsule Take 1 capsule (200 mg total) by mouth at bedtime. Take as needed for cough  12 capsule  0  . oseltamivir (TAMIFLU) 75 MG capsule Take 1 capsule (75 mg total) by mouth every 12 (twelve) hours.  10 capsule  0  . RELPAX 20 MG tablet TAKE ONE TABLET BY MOUTH AT ONSET OF HEADACHE, MAY REPEAT IN 1-2 HOURS IF NO RELIEF  8 tablet  1   No current facility-administered medications on file prior to visit.    Allergies  Allergen Reactions  . Sulfamethoxazole-Trimethoprim     REACTION: Pt does not remember, was an infant    No past medical history on file.  Past Surgical History  Procedure Laterality Date  . Hernia repair      infant    Family History  Problem Relation Age of Onset  . Hypertension Mother   . Migraines Mother   . Healthy Father   . Coronary artery disease Maternal Grandfather   . Emphysema Maternal Grandfather   . Colon cancer Neg Hx   . Prostate cancer Neg Hx     History   Social History  . Marital Status: Married    Spouse Name: N/A    Number of Children: 2  . Years of Education: N/A   Occupational History  . Realtor    Social History Main Topics  .  Smoking status: Never Smoker   . Smokeless tobacco: Never Used  . Alcohol Use: Yes  . Drug Use: No  . Sexual Activity: Not on file   Other Topics Concern  . Not on file   Social History Narrative  . No narrative on file   Review of Systems No rash Vomited once last night--relates to coughing spell Appetite is off    Objective:   Physical Exam  Constitutional: He appears well-developed and well-nourished. No distress.  HENT:  No sinus tenderness TMs normal Moderate nasal inflammation and swelling No sig pharyngeal injection or exudates  Neck: Normal range of motion. Neck supple.  Pulmonary/Chest: Effort normal and breath sounds normal. No respiratory distress. He has no wheezes. He has no rales.  Lymphadenopathy:    He has no cervical adenopathy.          Assessment & Plan:

## 2013-03-15 ENCOUNTER — Telehealth: Payer: Self-pay | Admitting: *Deleted

## 2013-03-28 ENCOUNTER — Telehealth: Payer: Self-pay

## 2013-03-28 NOTE — Telephone Encounter (Signed)
V/M was left; pt was seen 03/14/13 and is feeling a lot better but still has persistent non prod cough. No fever. Hycodan did help cough but pt has no more Hycodan. Pt wants to know if needs more antibiotic and cough med; pt has tickle in throat and lungs that brings about dry cough. Pt request cb. [facility]  [place].

## 2013-03-29 MED ORDER — HYDROCODONE-HOMATROPINE 5-1.5 MG/5ML PO SYRP: 5.0000 mL | ORAL_SOLUTION | Freq: Every evening | ORAL | Status: DC | PRN

## 2013-03-29 NOTE — Telephone Encounter (Signed)
Spoke with patient and advised results, he has benzonatate left over but would like a refill on the hycodan.  rx printed for patient to pick-up and left at front desk

## 2013-03-29 NOTE — Telephone Encounter (Signed)
Sounds like he has the residual cough that sometimes lingers after an infection  We can refill the hycodan but he will need to pick up Rx Prepare if he wants it  Otherwise, can send Rx for benzonatate 239m tid prn cough #30 x 0

## 2013-03-31 ENCOUNTER — Other Ambulatory Visit: Payer: Self-pay | Admitting: Internal Medicine

## 2013-10-10 ENCOUNTER — Telehealth: Payer: Self-pay | Admitting: *Deleted

## 2013-10-10 MED ORDER — IVERMECTIN 0.5 % EX LOTN: TOPICAL_LOTION | CUTANEOUS | Status: DC

## 2013-10-10 NOTE — Telephone Encounter (Signed)
Fax directions too long.

## 2013-10-10 NOTE — Telephone Encounter (Signed)
Rx faxed to [facility].

## 2013-11-29 ENCOUNTER — Telehealth: Payer: Self-pay | Admitting: Internal Medicine

## 2013-11-29 MED ORDER — IVERMECTIN 0.5 % EX LOTN: TOPICAL_LOTION | CUTANEOUS | Status: DC

## 2013-11-29 NOTE — Telephone Encounter (Signed)
Use RID which is OTC

## 2013-11-29 NOTE — Telephone Encounter (Signed)
After speaking with wife and got more information and spoke with Webb Silversmith, NP ok to refill the [facility].

## 2013-11-29 NOTE — Telephone Encounter (Signed)
Adam Dunn/spouse Phone [phone]: Called to request Rx for SK-Lice be called for husband to [place].  Daughter diagnosed with head lice by PCP 34/19/37; Pediatrician  suggested treating entire family at same time.  Pt is asymptomatic.  Informed Dr Silvio Pate is out of the office; another provider will review the Rx request and determine if Rx can be called in.  Please call back only if on call provider declines Rx request.

## 2013-11-29 NOTE — Addendum Note (Signed)
Addended by: Despina Hidden on: 11/29/2013 02:20 PM   Modules accepted: Orders

## 2014-07-11 ENCOUNTER — Other Ambulatory Visit: Payer: Self-pay | Admitting: Internal Medicine

## 2014-07-11 NOTE — Telephone Encounter (Signed)
Ok to fill? Pt last seen 03/2013 for sinus inf, rx last filled 03/2013, no future appts scheduled

## 2014-07-12 NOTE — Telephone Encounter (Signed)
rx sent to pharmacy by e-script  

## 2014-07-12 NOTE — Telephone Encounter (Signed)
Approved: Okay #8 x 0 Have him schedule follow up before he would need any more (PE in 6 months?)

## 2014-08-25 ENCOUNTER — Encounter: Payer: Self-pay | Admitting: Emergency Medicine

## 2014-08-25 ENCOUNTER — Emergency Department
Admission: EM | Admit: 2014-08-25 | Discharge: 2014-08-25 | Disposition: A | Discharge status: Home / Self Care | Payer: 59 | Source: Home / Self Care | Attending: Family Medicine | Admitting: Family Medicine

## 2014-08-25 DIAGNOSIS — B9789 Other viral agents as the cause of diseases classified elsewhere: Principal | ICD-10-CM

## 2014-08-25 DIAGNOSIS — J069 Acute upper respiratory infection, unspecified: Secondary | ICD-10-CM

## 2014-08-25 LAB — POCT RAPID STREP A (OFFICE): RAPID STREP A SCREEN: NEGATIVE

## 2014-08-25 MED ORDER — BENZONATATE 200 MG PO CAPS: 200.0000 mg | ORAL_CAPSULE | Freq: Every day | ORAL | Status: DC

## 2014-08-25 MED ORDER — AZITHROMYCIN 250 MG PO TABS: ORAL_TABLET | ORAL | Status: DC

## 2014-08-25 NOTE — ED Notes (Signed)
Pt c/o fever, sore thoat, body aches and cough x2 days. Pt last had advil at 445pm.

## 2014-08-25 NOTE — Discharge Instructions (Signed)
Take plain guaifenesin (1269m extended release tabs such as Mucinex) twice daily, with plenty of water, for cough and congestion.  May add Pseudoephedrine (353m one or two every 4 to 6 hours) for sinus congestion.  Get adequate rest.   May use Afrin nasal spray (or generic oxymetazoline) twice daily for about 5 days and then discontinue.  Also recommend using saline nasal spray several times daily and saline nasal irrigation (AYR is a common brand).   Try warm salt water gargles for sore throat.  Stop all antihistamines for now, and other non-prescription cough/cold preparations. May take Ibuprofen 20073m4 tabs every 8 hours with food for sore throat, fever, etc.   Follow-up with family doctor if not improving about10 days.

## 2014-08-25 NOTE — ED Provider Notes (Signed)
CSN: [id number]     Arrival date & time 08/25/14  1748 History   First MD Initiated Contact with Patient 08/25/14 1800     Chief Complaint  Patient presents with  . Sore Throat     HPI Comments: Patient developed flu like symptoms 48 hours ago with initial sore throat, fatigue, chills, sinus congestion, and fever to 101.  He developed a non-productive cough.  He states that he always has a prolonged cough when he develops a cold.  The history is provided by the patient.    History reviewed. No pertinent past medical history. Past Surgical History  Procedure Laterality Date  . Hernia repair      infant   Family History  Problem Relation Age of Onset  . Hypertension Mother   . Migraines Mother   . Healthy Father   . Coronary artery disease Maternal Grandfather   . Emphysema Maternal Grandfather   . Colon cancer Neg Hx   . Prostate cancer Neg Hx    Social History  Substance Use Topics  . Smoking status: Never Smoker   . Smokeless tobacco: Never Used  . Alcohol Use: Yes    Review of Systems + sore throat + cough No pleuritic pain No wheezing + nasal congestion + post-nasal drainage No sinus pain/pressure No itchy/red eyes No earache + dizziness No hemoptysis No SOB + fever, + chills + nausea No vomiting No abdominal pain No diarrhea No urinary symptoms No skin rash + fatigue + myalgias + headache Used OTC meds without relief  Allergies  Sulfamethoxazole-trimethoprim  Home Medications   Prior to Admission medications   Medication Sig Start Date End Date Taking? Authorizing Provider  azithromycin (ZITHROMAX Z-PAK) 250 MG tablet Take 2 tabs today; then begin one tab once daily for 4 more days. 08/25/14   Kandra Nicolas, MD  benzonatate (TESSALON) 200 MG capsule Take 1 capsule (200 mg total) by mouth at bedtime. Take as needed for cough 08/25/14   Kandra Nicolas, MD  dextromethorphan (DELSYM) 30 MG/5ML liquid Take by mouth as needed for cough.    Historical  Provider, MD  HYDROcodone-homatropine (HYCODAN) 5-1.5 MG/5ML syrup Take 5 mLs by mouth at bedtime as needed for cough. 03/29/13   Venia Carbon, MD  Ivermectin 0.5 % LOTN Apply 1 tube to dry scalp and hair closest to scalp first, then apply outward towards ends of hair; completely covering scalp and hair. Leave on for 10 minutes (start timing treatment after the scalp and hair have been completely covered). The hair should then be rinsed thoroughly with warm water. Avoid contact with the eyes. 11/29/13   Jearld Fenton, NP  omeprazole (PRILOSEC OTC) 20 MG tablet Take 20 mg by mouth daily as needed.    Historical Provider, MD  RELPAX 20 MG tablet TAKE ONE TABLET BY MOUTH AT ONSET OF HEADACHE, MAY REPEAT IN 1-2 HOURS IF NO RELIEF 07/12/14   Venia Carbon, MD   BP 156/91 mmHg  Pulse 106  Temp(Src) 99.2 F (37.3 C) (Oral)  SpO2 99% Physical Exam Nursing notes and Vital Signs reviewed. Appearance:  Patient appears stated age, and in no acute distress Eyes:  Pupils are equal, round, and reactive to light and accomodation.  Extraocular movement is intact.  Conjunctivae are not inflamed  Ears:  Canals normal.  Tympanic membranes normal.  Nose:  Mildly congested turbinates.  No sinus tenderness.  Pharynx:  Normal Neck:  Supple.  Tender enlarged posterior nodes are palpated bilaterally  Lungs:  Clear to auscultation.  Breath sounds are equal.  Moving air well. Heart:  Regular rate and rhythm without murmurs, rubs, or gallops.  Abdomen:  Nontender without masses or hepatosplenomegaly.  Bowel sounds are present.  No CVA or flank tenderness.  Extremities:  No edema.  No calf tenderness Skin:  No rash present.   ED Course  Procedures  None    Labs Reviewed  POCT RAPID STREP A (OFFICE) negative      MDM   1. Viral URI with cough    Begin empiric Z-pack for atypical coverage.  Prescription written for Benzonatate [facility]) to take at bedtime for night-time cough.   Take plain guaifenesin  (1248m extended release tabs such as Mucinex) twice daily, with plenty of water, for cough and congestion.  May add Pseudoephedrine (368m one or two every 4 to 6 hours) for sinus congestion.  Get adequate rest.   May use Afrin nasal spray (or generic oxymetazoline) twice daily for about 5 days and then discontinue.  Also recommend using saline nasal spray several times daily and saline nasal irrigation (AYR is a common brand).   Try warm salt water gargles for sore throat.  Stop all antihistamines for now, and other non-prescription cough/cold preparations. May take Ibuprofen 20026m4 tabs every 8 hours with food for sore throat, fever, etc.   Follow-up with family doctor if not improving about10 days.     SteKandra NicolasD 08/26/14 [phone]

## 2014-09-29 ENCOUNTER — Encounter: Payer: Self-pay | Admitting: Family Medicine

## 2014-09-29 ENCOUNTER — Ambulatory Visit (INDEPENDENT_AMBULATORY_CARE_PROVIDER_SITE_OTHER): Payer: 59 | Admitting: Family Medicine

## 2014-09-29 VITALS — BP 118/82 | HR 76 | Temp 97.9°F | Wt 239.0 lb

## 2014-09-29 DIAGNOSIS — J209 Acute bronchitis, unspecified: Secondary | ICD-10-CM

## 2014-09-29 MED ORDER — PREDNISONE 20 MG PO TABS: ORAL_TABLET | ORAL | Status: DC

## 2014-09-29 MED ORDER — CLARITHROMYCIN 500 MG PO TABS: 500.0000 mg | ORAL_TABLET | Freq: Two times a day (BID) | ORAL | Status: DC

## 2014-09-29 NOTE — Progress Notes (Signed)
OFFICE NOTE  09/29/2014  CC:  Chief Complaint  Patient presents with  . Cough     HPI: Patient is a 34 y.o. Caucasian male who is here for cough. 5 wks URI/cough--z pack helped some.   Doxy also given-not much help.   Prednisone just finished yesterday--took it 5 days.  Has albuterol inhaler. Delsym tried, phenergan+ codeine also rx'd recently. Pt is s/p 3 visits to [facility] walk in clinic over last 5 wks.  Sx's that still remain, wheezy cough, dry.  Not much upper resp sx's, no ST.  No achiness or malaise.  No fevers.  Pt not a smoker.  Pertinent PMH:  Pt reported hx of recurrent bronchitis x 1 time per year.  MEDS:  Outpatient Prescriptions Prior to Visit  Medication Sig Dispense Refill  . Ivermectin 0.5 % LOTN Apply 1 tube to dry scalp and hair closest to scalp first, then apply outward towards ends of hair; completely covering scalp and hair. Leave on for 10 minutes (start timing treatment after the scalp and hair have been completely covered). The hair should then be rinsed thoroughly with warm water. Avoid contact with the eyes. 117 g 0  . omeprazole (PRILOSEC OTC) 20 MG tablet Take 20 mg by mouth daily as needed.    . benzonatate (TESSALON) 200 MG capsule Take 1 capsule (200 mg total) by mouth at bedtime. Take as needed for cough (Patient not taking: Reported on 09/29/2014) 12 capsule 0  . dextromethorphan (DELSYM) 30 MG/5ML liquid Take by mouth as needed for cough.    Marland Kitchen HYDROcodone-homatropine (HYCODAN) 5-1.5 MG/5ML syrup Take 5 mLs by mouth at bedtime as needed for cough. (Patient not taking: Reported on 09/29/2014) 120 mL 0  . RELPAX 20 MG tablet TAKE ONE TABLET BY MOUTH AT ONSET OF HEADACHE, MAY REPEAT IN 1-2 HOURS IF NO RELIEF (Patient not taking: Reported on 09/29/2014) 8 tablet 0  . azithromycin (ZITHROMAX Z-PAK) 250 MG tablet Take 2 tabs today; then begin one tab once daily for 4 more days. 6 tablet 0   No facility-administered medications prior to visit.    PE: Blood  pressure 118/82, pulse 76, temperature 97.9 F (36.6 C), weight 239 lb (108.41 kg), SpO2 99 %. VS: noted--normal. Gen: alert, NAD, WELL- APPEARING. HEENT: eyes without injection, drainage, or swelling.  Ears: EACs clear, TMs with normal light reflex and landmarks.  Nose: Clear rhinorrhea, with some dried, crusty exudate adherent to mildly injected mucosa.  No purulent d/c.  No paranasal sinus TTP.  No facial swelling.  Throat and mouth without focal lesion.  No pharyngial swelling, erythema, or exudate.   Neck: supple, no LAD.   LUNGS: CTA bilat, nonlabored resps.  Lots of coughing with exhalation.  No wheezing, no crackles. CV: RRR, no m/r/g. EXT: no c/c/e SKIN: no rash  IMPRESSION AND PLAN:  Prolonged asthmatic bronchitis. Alb/Atr neb in office today: significant subjective improvement after. Prednisone 60x 5, 40 x 5, then 20 x 5. Clarithromycin 500 mg bid x 10d. Continue albuterol HFA 2 p q4h prn.  An After Visit Summary was printed and given to the patient.  FOLLOW UP: with PMD in 4-5 d if not improving

## 2014-10-17 ENCOUNTER — Encounter (INDEPENDENT_AMBULATORY_CARE_PROVIDER_SITE_OTHER): Payer: Self-pay

## 2014-10-17 ENCOUNTER — Ambulatory Visit (INDEPENDENT_AMBULATORY_CARE_PROVIDER_SITE_OTHER)
Admission: RE | Admit: 2014-10-17 | Discharge: 2014-10-17 | Disposition: A | Discharge status: Home / Self Care | Payer: 59 | Source: Ambulatory Visit | Attending: Internal Medicine | Admitting: Internal Medicine

## 2014-10-17 ENCOUNTER — Encounter: Payer: Self-pay | Admitting: Internal Medicine

## 2014-10-17 ENCOUNTER — Encounter: Payer: Self-pay | Admitting: *Deleted

## 2014-10-17 ENCOUNTER — Ambulatory Visit (INDEPENDENT_AMBULATORY_CARE_PROVIDER_SITE_OTHER): Payer: 59 | Admitting: Internal Medicine

## 2014-10-17 VITALS — BP 128/80 | HR 88 | Temp 97.5°F | Wt 261.0 lb

## 2014-10-17 DIAGNOSIS — R05 Cough: Secondary | ICD-10-CM | POA: Diagnosis not present

## 2014-10-17 DIAGNOSIS — R059 Cough, unspecified: Secondary | ICD-10-CM

## 2014-10-17 DIAGNOSIS — Z23 Encounter for immunization: Secondary | ICD-10-CM | POA: Diagnosis not present

## 2014-10-17 DIAGNOSIS — J45909 Unspecified asthma, uncomplicated: Secondary | ICD-10-CM | POA: Insufficient documentation

## 2014-10-17 MED ORDER — BENZONATATE 200 MG PO CAPS: 200.0000 mg | ORAL_CAPSULE | Freq: Three times a day (TID) | ORAL | Status: DC | PRN

## 2014-10-17 MED ORDER — ELETRIPTAN HYDROBROMIDE 40 MG PO TABS: 40.0000 mg | ORAL_TABLET | ORAL | Status: DC | PRN

## 2014-10-17 NOTE — Progress Notes (Signed)
Pre visit review using our clinic review tool, if applicable. No additional management support is needed unless otherwise documented below in the visit note. 

## 2014-10-17 NOTE — Addendum Note (Signed)
Addended by: Emelia Salisbury C on: 10/17/2014 12:23 PM   Modules accepted: Orders

## 2014-10-17 NOTE — Progress Notes (Signed)
   Subjective:    Patient ID: Adam Dunn, male    DOB: Dec 31, 1980, 34 y.o.   MRN: [id number]  HPI Here due to persistent cough Coughing for at least 2 months--baby just born 2 months ago  Has recurrent infections that z-pak usually helps Got it at beginning of illness Then doxy, then clarithromycin/prednisone  Is better but not gone Very aggravating No fever No SOB No chills and doesn't feel sick  Prednisone may have helped in the first week-- then not as effective during the wean Cough triggered by talking--which he does a lot in job  Delsym some help Has albuterol inhaler--?some help  Benzonatate some help also Didn't like hydrocodone--made him itch (but it worked)  Current Outpatient Prescriptions on File Prior to Visit  Medication Sig Dispense Refill  . omeprazole (PRILOSEC OTC) 20 MG tablet Take 20 mg by mouth daily as needed.    . RELPAX 20 MG tablet TAKE ONE TABLET BY MOUTH AT ONSET OF HEADACHE, MAY REPEAT IN 1-2 HOURS IF NO RELIEF 8 tablet 0   No current facility-administered medications on file prior to visit.    Allergies  Allergen Reactions  . Sulfamethoxazole-Trimethoprim     REACTION: Pt does not remember, was an infant    No past medical history on file.  Past Surgical History  Procedure Laterality Date  . Hernia repair      infant    Family History  Problem Relation Age of Onset  . Hypertension Mother   . Migraines Mother   . Healthy Father   . Coronary artery disease Maternal Grandfather   . Emphysema Maternal Grandfather   . Colon cancer Neg Hx   . Prostate cancer Neg Hx     Social History   Social History  . Marital Status: Married    Spouse Name: N/A  . Number of Children: 3  . Years of Education: N/A   Occupational History  . Realtor    Social History Main Topics  . Smoking status: Never Smoker   . Smokeless tobacco: Never Used  . Alcohol Use: Yes  . Drug Use: No  . Sexual Activity: Not on file   Other Topics Concern    . Not on file   Social History Narrative   Review of Systems Migraine today--needs higher dose of relpax No vomiting or diarrhea Appetite "too good"    Objective:   Physical Exam  Constitutional: He appears well-developed and well-nourished. No distress.  Frequent harsh cough  HENT:  Mouth/Throat: Oropharynx is clear and moist. No oropharyngeal exudate.  TMs normal Mild nasal congestion  Neck: Normal range of motion. Neck supple.  Pulmonary/Chest: Effort normal and breath sounds normal. No respiratory distress. He has no wheezes. He has no rales.  Normal expiratory phase  Lymphadenopathy:    He has no cervical adenopathy.          Assessment & Plan:

## 2014-10-17 NOTE — Assessment & Plan Note (Addendum)
Has happened frequently with infections Prednisone? Some help Albuterol and multiple antitussives do help Spirometry and CXR are reassuring Rx for benzonatate

## 2014-10-26 ENCOUNTER — Ambulatory Visit (INDEPENDENT_AMBULATORY_CARE_PROVIDER_SITE_OTHER): Payer: 59 | Admitting: Family

## 2014-10-26 ENCOUNTER — Encounter: Payer: Self-pay | Admitting: Family

## 2014-10-26 VITALS — BP 132/78 | HR 96 | Temp 98.4°F | Ht 66.0 in | Wt 264.8 lb

## 2014-10-26 DIAGNOSIS — G43809 Other migraine, not intractable, without status migrainosus: Secondary | ICD-10-CM

## 2014-10-26 DIAGNOSIS — R05 Cough: Secondary | ICD-10-CM | POA: Diagnosis not present

## 2014-10-26 DIAGNOSIS — R059 Cough, unspecified: Secondary | ICD-10-CM

## 2014-10-26 DIAGNOSIS — G43009 Migraine without aura, not intractable, without status migrainosus: Secondary | ICD-10-CM | POA: Insufficient documentation

## 2014-10-26 MED ORDER — FLUTICASONE FUROATE-VILANTEROL 100-25 MCG/INH IN AEPB: 1.0000 | INHALATION_SPRAY | Freq: Every day | RESPIRATORY_TRACT | Status: DC

## 2014-10-26 MED ORDER — ACETAMINOPHEN-CODEINE #3 300-30 MG PO TABS: 1.0000 | ORAL_TABLET | ORAL | Status: DC | PRN

## 2014-10-26 MED ORDER — OMEPRAZOLE MAGNESIUM 20 MG PO TBEC: 20.0000 mg | DELAYED_RELEASE_TABLET | Freq: Every day | ORAL | Status: DC | PRN

## 2014-10-26 NOTE — Assessment & Plan Note (Signed)
Symptoms and exam consistent with atypical migraine headache that appears refractory to Relpax. Neurological exam is normal. Declined Toradol injection. Start Tylenol #3 as needed for discomfort. Follow up for symptoms worsening or worst headache of his life.

## 2014-10-26 NOTE — Progress Notes (Signed)
Subjective:    Patient ID: Adam Dunn, male    DOB: 06-Aug-1980, 34 y.o.   MRN: [id number]  Chief Complaint  Patient presents with  . Headache    HTN may be attributing to the Headache  . Cough    Viral URI with cough  . Nausea    Usual when pt has a headache    HPI:  Adam Dunn is a 34 y.o. male who  has no past medical history on file. and presents today for an acute office visit.  1.) Headache - Associated symptom of a headache has been going on for about 1 week. Describes the headache as a constant dull with occasional throbbing and does have some nausea with the headache. Modifying factors include Relpax which did not help with the headache and notes this is not similar to any of his previous headaches. Denies any neck stiffness or worst headache of his life. Was recently on steroids with question of headaches related to the steroid taper.   Allergies  Allergen Reactions  . Sulfamethoxazole-Trimethoprim     REACTION: Pt does not remember, was an infant    Current Outpatient Prescriptions on File Prior to Visit  Medication Sig Dispense Refill  . benzonatate (TESSALON) 200 MG capsule Take 1 capsule (200 mg total) by mouth 3 (three) times daily as needed for cough. 60 capsule 1  . eletriptan (RELPAX) 40 MG tablet Take 1 tablet (40 mg total) by mouth as needed for migraine or headache. May repeat in 2 hours if headache persists or recurs. (Patient not taking: Reported on 10/26/2014) 10 tablet 11   No current facility-administered medications on file prior to visit.     Past Surgical History  Procedure Laterality Date  . Hernia repair      infant    Review of Systems  Constitutional: Negative for fever and chills.  Respiratory: Positive for cough. Negative for chest tightness and shortness of breath.   Gastrointestinal: Positive for nausea.  Neurological: Positive for headaches.      Objective:    BP 132/78 mmHg  Pulse 96  Temp(Src) 98.4 F (36.9 C) (Oral)   Ht 5' 6"  (1.676 m)  Wt 264 lb 12 oz (120.09 kg)  BMI 42.75 kg/m2  SpO2 97% Nursing note and vital signs reviewed.  Physical Exam  Constitutional: He is oriented to person, place, and time. He appears well-developed and well-nourished. No distress.  Cardiovascular: Normal rate, regular rhythm, normal heart sounds and intact distal pulses.   Pulmonary/Chest: Effort normal and breath sounds normal. No respiratory distress. He has no wheezes. He has no rales. He exhibits no tenderness.  Neurological: He is alert and oriented to person, place, and time.  Skin: Skin is warm and dry.  Psychiatric: He has a normal mood and affect. His behavior is normal. Judgment and thought content normal.       Assessment & Plan:   Problem List Items Addressed This Visit      Cardiovascular and Mediastinum   Atypical migraine - Primary    Symptoms and exam consistent with atypical migraine headache that appears refractory to Relpax. Neurological exam is normal. Declined Toradol injection. Start Tylenol #3 as needed for discomfort. Follow up for symptoms worsening or worst headache of his life.       Relevant Medications   acetaminophen-codeine (TYLENOL #3) 300-30 MG tablet     Other   Cough    Continues to experience cough most likely associated with resolving bronchitis.  No adventitious sounds noted. Start Breo and continue over the counter medications as needed for symptom relief. Follow up for worsening of symptoms.       Relevant Medications   Fluticasone Furoate-Vilanterol (BREO ELLIPTA) 100-25 MCG/INH AEPB

## 2014-10-26 NOTE — Progress Notes (Signed)
Pre visit review using our clinic review tool, if applicable. No additional management support is needed unless otherwise documented below in the visit note. 

## 2014-10-26 NOTE — Assessment & Plan Note (Addendum)
Continues to experience cough most likely associated with resolving bronchitis. No adventitious sounds noted. Start Breo and continue over the counter medications as needed for symptom relief. Follow up for worsening of symptoms.

## 2014-10-26 NOTE — Patient Instructions (Signed)
Thank you for choosing [facility].  Summary/Instructions:  Your prescription(s) have been submitted to your pharmacy or been printed and provided for you. Please take as directed and contact our office if you believe you are having problem(s) with the medication(s) or have any questions.  If your symptoms worsen or fail to improve, please contact our office for further instruction, or in case of emergency go directly to the emergency room at the closest medical facility.    General Headache Without Cause A headache is pain or discomfort felt around the head or neck area. The specific cause of a headache may not be found. There are many causes and types of headaches. A few common ones are:  Tension headaches.  Migraine headaches.  Cluster headaches.  Chronic daily headaches. HOME CARE INSTRUCTIONS  Watch your condition for any changes. Take these steps to help with your condition: Managing Pain  Take over-the-counter and prescription medicines only as told by your health care provider.  Lie down in a dark, quiet room when you have a headache.  If directed, apply ice to the head and neck area:  Put ice in a plastic bag.  Place a towel between your skin and the bag.  Leave the ice on for 20 minutes, 2-3 times per day.  Use a heating pad or hot shower to apply heat to the head and neck area as told by your health care provider.  Keep lights dim if bright lights bother you or make your headaches worse. Eating and Drinking  Eat meals on a regular schedule.  Limit alcohol use.  Decrease the amount of caffeine you drink, or stop drinking caffeine. General Instructions  Keep all follow-up visits as told by your health care provider. This is important.  Keep a headache journal to help find out what may trigger your headaches. For example, write down:  What you eat and drink.  How much sleep you get.  Any change to your diet or medicines.  Try massage or other  relaxation techniques.  Limit stress.  Sit up straight, and do not tense your muscles.  Do not use tobacco products, including cigarettes, chewing tobacco, or e-cigarettes. If you need help quitting, ask your health care provider.  Exercise regularly as told by your health care provider.  Sleep on a regular schedule. Get 7-9 hours of sleep, or the amount recommended by your health care provider. SEEK MEDICAL CARE IF:   Your symptoms are not helped by medicine.  You have a headache that is different from the usual headache.  You have nausea or you vomit.  You have a fever. SEEK IMMEDIATE MEDICAL CARE IF:   Your headache becomes severe.  You have repeated vomiting.  You have a stiff neck.  You have a loss of vision.  You have problems with speech.  You have pain in the eye or ear.  You have muscular weakness or loss of muscle control.  You lose your balance or have trouble walking.  You feel faint or pass out.  You have confusion.   This information is not intended to replace advice given to you by your health care provider. Make sure you discuss any questions you have with your health care provider.   Document Released: 12/22/2004 Document Revised: 09/12/2014 Document Reviewed: 04/16/2014 Elsevier Interactive Patient Education [employer].

## 2015-01-29 DIAGNOSIS — F4322 Adjustment disorder with anxiety: Secondary | ICD-10-CM | POA: Diagnosis not present

## 2015-02-15 DIAGNOSIS — F4322 Adjustment disorder with anxiety: Secondary | ICD-10-CM | POA: Diagnosis not present

## 2015-03-01 DIAGNOSIS — F4322 Adjustment disorder with anxiety: Secondary | ICD-10-CM | POA: Diagnosis not present

## 2015-03-11 DIAGNOSIS — F4322 Adjustment disorder with anxiety: Secondary | ICD-10-CM | POA: Diagnosis not present

## 2015-03-25 DIAGNOSIS — F4322 Adjustment disorder with anxiety: Secondary | ICD-10-CM | POA: Diagnosis not present

## 2015-04-08 DIAGNOSIS — F4322 Adjustment disorder with anxiety: Secondary | ICD-10-CM | POA: Diagnosis not present

## 2015-04-29 DIAGNOSIS — F4322 Adjustment disorder with anxiety: Secondary | ICD-10-CM | POA: Diagnosis not present

## 2015-05-14 DIAGNOSIS — F4322 Adjustment disorder with anxiety: Secondary | ICD-10-CM | POA: Diagnosis not present

## 2015-05-27 DIAGNOSIS — F4322 Adjustment disorder with anxiety: Secondary | ICD-10-CM | POA: Diagnosis not present

## 2015-06-11 DIAGNOSIS — F4322 Adjustment disorder with anxiety: Secondary | ICD-10-CM | POA: Diagnosis not present

## 2015-06-25 DIAGNOSIS — F4322 Adjustment disorder with anxiety: Secondary | ICD-10-CM | POA: Diagnosis not present

## 2015-07-10 DIAGNOSIS — F4322 Adjustment disorder with anxiety: Secondary | ICD-10-CM | POA: Diagnosis not present

## 2015-07-23 DIAGNOSIS — F4322 Adjustment disorder with anxiety: Secondary | ICD-10-CM | POA: Diagnosis not present

## 2015-08-06 DIAGNOSIS — F4322 Adjustment disorder with anxiety: Secondary | ICD-10-CM | POA: Diagnosis not present

## 2015-08-07 ENCOUNTER — Telehealth: Payer: Self-pay

## 2015-08-07 NOTE — Telephone Encounter (Signed)
Pt has moved to [place] and would like to transfer to Dr Elna Breslow.  Is this ok with you Dr Silvio Pate?

## 2015-08-07 NOTE — Telephone Encounter (Signed)
yes

## 2015-08-07 NOTE — Telephone Encounter (Signed)
I called patient and LVM to call back and make and app. Both doctor agreed to the change.

## 2015-08-07 NOTE — Telephone Encounter (Signed)
Patient called and states he would like to tranfer to you. He saw you last year and really like you. And now he live out this way. He is going to call his office to get and ok from his doctor.  Dr. Elna Breslow is that ok with you?

## 2015-08-07 NOTE — Telephone Encounter (Signed)
Ok with me 

## 2015-08-09 ENCOUNTER — Other Ambulatory Visit: Payer: Self-pay | Admitting: Internal Medicine

## 2015-08-13 NOTE — Telephone Encounter (Signed)
Call patient again, no answer.

## 2015-08-19 ENCOUNTER — Encounter: Payer: Self-pay | Admitting: Family

## 2015-08-19 ENCOUNTER — Ambulatory Visit (INDEPENDENT_AMBULATORY_CARE_PROVIDER_SITE_OTHER): Payer: 59 | Admitting: Family

## 2015-08-19 VITALS — BP 132/92 | HR 87 | Temp 98.0°F | Resp 18 | Ht 66.0 in | Wt 260.0 lb

## 2015-08-19 DIAGNOSIS — G43009 Migraine without aura, not intractable, without status migrainosus: Secondary | ICD-10-CM

## 2015-08-19 DIAGNOSIS — F411 Generalized anxiety disorder: Secondary | ICD-10-CM

## 2015-08-19 DIAGNOSIS — G43809 Other migraine, not intractable, without status migrainosus: Secondary | ICD-10-CM | POA: Diagnosis not present

## 2015-08-19 MED ORDER — PROPRANOLOL HCL 40 MG PO TABS: 40.0000 mg | ORAL_TABLET | Freq: Three times a day (TID) | ORAL | 0 refills | Status: DC | PRN

## 2015-08-19 MED ORDER — ELETRIPTAN HYDROBROMIDE 40 MG PO TABS: 40.0000 mg | ORAL_TABLET | ORAL | 11 refills | Status: DC | PRN

## 2015-08-19 MED FILL — ELETRIPTAN HBR 20 MG TABLET: 20 | 30 days supply | Qty: 8 | Fill #0

## 2015-08-19 MED FILL — PROPRANOLOL 40 MG TABLET: 40 | 10 days supply | Qty: 30 | Fill #0

## 2015-08-19 NOTE — Progress Notes (Signed)
Subjective:    Patient ID: Adam Dunn, male    DOB: 24-Jul-1980, 35 y.o.   MRN: [id number]  Chief Complaint  Patient presents with  . Establish Care    refill of relpax, wants to discuss another medication    HPI:  Adam Dunn is a 35 y.o. male who  has no past medical history on file. and presents today for a follow up office visit.   1.) Migraine headaches - Currently maintained on Eletriptan. Reports taking the medication as prescribed and denies adverse side effects. Continues to experience headaches that are described as a squeezing headache which is resolved with the Relpax. No sensitivity to light/sound without nausea or vomiting. Denies worst headache of life. Frequency of headaches is 1-5 per month and occasionally triggered by weather.    2.) Anxiety - This is a new problem. Associated symptom of anxiety has been gradually increasing over the past several weeks. Currently working with psychology with the recommendation of starting Inderal to help with his symptoms as they are primarily related to work.   Allergies  Allergen Reactions  . Sulfamethoxazole-Trimethoprim     REACTION: Pt does not remember, was an infant     Current Outpatient Prescriptions on File Prior to Visit  Medication Sig Dispense Refill  . omeprazole (PRILOSEC OTC) 20 MG tablet Take 1 tablet (20 mg total) by mouth daily as needed. 90 tablet 1   No current facility-administered medications on file prior to visit.     No past medical history on file.   Review of Systems  Constitutional: Negative for chills and fever.  Eyes: Negative for visual disturbance.  Respiratory: Negative for chest tightness and shortness of breath.   Cardiovascular: Negative for chest pain, palpitations and leg swelling.  Musculoskeletal: Negative for neck pain.  Neurological: Positive for headaches. Negative for dizziness and weakness.      Objective:    BP (!) 132/92 (BP Location: Left Arm, Patient Position:  Sitting, Cuff Size: Large)   Pulse 87   Temp 98 F (36.7 C) (Oral)   Resp 18   Ht 5' 6"  (1.676 m)   Wt 260 lb (117.9 kg)   SpO2 97%   BMI 41.97 kg/m  Nursing note and vital signs reviewed.  Physical Exam  Constitutional: He is oriented to person, place, and time. He appears well-developed and well-nourished. No distress.  Eyes: Conjunctivae and EOM are normal. Pupils are equal, round, and reactive to light.  Neck: Neck supple.  Cardiovascular: Normal rate, regular rhythm, normal heart sounds and intact distal pulses.   Pulmonary/Chest: Effort normal and breath sounds normal.  Lymphadenopathy:    He has no cervical adenopathy.  Neurological: He is alert and oriented to person, place, and time. No cranial nerve deficit. Coordination normal.  Skin: Skin is warm and dry.  Psychiatric: He has a normal mood and affect. His behavior is normal. Judgment and thought content normal.       Assessment & Plan:   Problem List Items Addressed This Visit      Cardiovascular and Mediastinum   Atypical migraine - Primary    Migraine-type headaches are well controlled with current dosage of eletriptan. No adverse side effects. Denies worse headache of life or neck pain. Neurological exam is normal. Continue current dosage of eletriptan.      Relevant Medications   eletriptan (RELPAX) 40 MG tablet   propranolol (INDERAL) 40 MG tablet     Other   Anxiety state  Increased anxiety spurred on by work related situations. Currently sees a counselor which helps with his symptoms and is recommending a low dose medication. Start propranolol as needed for anxiety. Discussed stress and stress management. Follow up pending trial of medication.       Relevant Medications   propranolol (INDERAL) 40 MG tablet    Other Visit Diagnoses   None.      I have discontinued Mr. Hyppolite benzonatate, acetaminophen-codeine, fluticasone furoate-vilanterol, and RELPAX. I am also having him start on  propranolol. Additionally, I am having him maintain his omeprazole and eletriptan.   Meds ordered this encounter  Medications  . eletriptan (RELPAX) 40 MG tablet    Sig: Take 1 tablet (40 mg total) by mouth as needed for migraine or headache. May repeat in 2 hours if headache persists or recurs.    Dispense:  9 tablet    Refill:  11    Order Specific Question:   Supervising Provider    Answer:   Pricilla Holm A [[id number]]  . propranolol (INDERAL) 40 MG tablet    Sig: Take 1 tablet (40 mg total) by mouth 3 (three) times daily as needed (Anxiety).    Dispense:  30 tablet    Refill:  0    Order Specific Question:   Supervising Provider    Answer:   Pricilla Holm A [[id number]     Follow-up: Return in about 1 month (around 09/19/2015), or if symptoms worsen or fail to improve.  Mauricio Po, FNP

## 2015-08-19 NOTE — Patient Instructions (Signed)
Thank you for choosing [facility].  Summary/Instructions:  Continue to take your medication as prescribed.   Your prescription(s) have been submitted to your pharmacy or been printed and provided for you. Please take as directed and contact our office if you believe you are having problem(s) with the medication(s) or have any questions.  If your symptoms worsen or fail to improve, please contact our office for further instruction, or in case of emergency go directly to the emergency room at the closest medical facility.

## 2015-08-19 NOTE — Assessment & Plan Note (Signed)
Migraine-type headaches are well controlled with current dosage of eletriptan. No adverse side effects. Denies worse headache of life or neck pain. Neurological exam is normal. Continue current dosage of eletriptan.

## 2015-08-19 NOTE — Assessment & Plan Note (Signed)
Increased anxiety spurred on by work related situations. Currently sees a counselor which helps with his symptoms and is recommending a low dose medication. Start propranolol as needed for anxiety. Discussed stress and stress management. Follow up pending trial of medication.

## 2015-08-20 DIAGNOSIS — F4322 Adjustment disorder with anxiety: Secondary | ICD-10-CM | POA: Diagnosis not present

## 2015-09-02 DIAGNOSIS — F4322 Adjustment disorder with anxiety: Secondary | ICD-10-CM | POA: Diagnosis not present

## 2015-09-17 DIAGNOSIS — F4322 Adjustment disorder with anxiety: Secondary | ICD-10-CM | POA: Diagnosis not present

## 2015-09-18 ENCOUNTER — Encounter: Payer: Self-pay | Admitting: Family

## 2015-09-18 ENCOUNTER — Ambulatory Visit (INDEPENDENT_AMBULATORY_CARE_PROVIDER_SITE_OTHER): Payer: 59 | Admitting: Family

## 2015-09-18 VITALS — BP 112/80 | HR 96 | Temp 98.4°F | Resp 16 | Ht 66.0 in | Wt 257.0 lb

## 2015-09-18 DIAGNOSIS — G43809 Other migraine, not intractable, without status migrainosus: Secondary | ICD-10-CM | POA: Diagnosis not present

## 2015-09-18 DIAGNOSIS — F411 Generalized anxiety disorder: Secondary | ICD-10-CM | POA: Diagnosis not present

## 2015-09-18 DIAGNOSIS — G43009 Migraine without aura, not intractable, without status migrainosus: Secondary | ICD-10-CM

## 2015-09-18 DIAGNOSIS — Z23 Encounter for immunization: Secondary | ICD-10-CM | POA: Diagnosis not present

## 2015-09-18 MED ORDER — PROPRANOLOL HCL ER 80 MG PO CP24: 80.0000 mg | ORAL_CAPSULE | Freq: Every day | ORAL | 1 refills | Status: DC

## 2015-09-18 MED FILL — PROPRANOLOL ER 80 MG CAP: 80 | 30 days supply | Qty: 30 | Fill #0

## 2015-09-18 NOTE — Patient Instructions (Signed)
Thank you for choosing [facility].  SUMMARY AND INSTRUCTIONS:  Medication:  Increase the propranolol to 80 mg daily.   Your prescription(s) have been submitted to your pharmacy or been printed and provided for you. Please take as directed and contact our office if you believe you are having problem(s) with the medication(s) or have any questions.  Follow up:  If your symptoms worsen or fail to improve, please contact our office for further instruction, or in case of emergency go directly to the emergency room at the closest medical facility.

## 2015-09-18 NOTE — Progress Notes (Signed)
Subjective:    Patient ID: Adam Dunn, male    DOB: Apr 30, 1980, 35 y.o.   MRN: [id number]  Chief Complaint  Patient presents with  . Follow-up    follow up from last visit    HPI:  Adam Dunn is a 35 y.o. male who  has no past medical history on file. and presents today for a follow up office visit.   1.) Anxiety - Previously started on propranolol for both anxiety and migraines. Reports taking the medication as prescribed and denies adverse side effects. Notes that the medication has had some positive effects, however they do not last for a long period of time. Continues to work with his counselor for his anxiety.    Allergies  Allergen Reactions  . Sulfamethoxazole-Trimethoprim     REACTION: Pt does not remember, was an infant      Outpatient Medications Prior to Visit  Medication Sig Dispense Refill  . eletriptan (RELPAX) 40 MG tablet Take 1 tablet (40 mg total) by mouth as needed for migraine or headache. May repeat in 2 hours if headache persists or recurs. 9 tablet 11  . omeprazole (PRILOSEC OTC) 20 MG tablet Take 1 tablet (20 mg total) by mouth daily as needed. 90 tablet 1  . propranolol (INDERAL) 40 MG tablet Take 1 tablet (40 mg total) by mouth 3 (three) times daily as needed (Anxiety). 30 tablet 0   No facility-administered medications prior to visit.     Review of Systems  Constitutional: Negative for chills and fever.  Respiratory: Negative for chest tightness and shortness of breath.   Cardiovascular: Negative for chest pain, palpitations and leg swelling.  Psychiatric/Behavioral: The patient is not nervous/anxious.       Objective:    BP 112/80 (BP Location: Left Arm, Patient Position: Sitting, Cuff Size: Large)   Pulse 96   Temp 98.4 F (36.9 C) (Oral)   Resp 16   Ht 5' 6"  (1.676 m)   Wt 257 lb (116.6 kg)   SpO2 95%   BMI 41.48 kg/m  Nursing note and vital signs reviewed.  Physical Exam  Constitutional: He is oriented to person, place, and  time. He appears well-developed and well-nourished. No distress.  Cardiovascular: Normal rate, regular rhythm, normal heart sounds and intact distal pulses.   Pulmonary/Chest: Effort normal and breath sounds normal.  Neurological: He is alert and oriented to person, place, and time.  Skin: Skin is warm and dry.  Psychiatric: He has a normal mood and affect. His behavior is normal. Judgment and thought content normal.       Assessment & Plan:   Problem List Items Addressed This Visit      Cardiovascular and Mediastinum   Atypical migraine    Recently started on propranolol with mild improvement of migraine headaches. Increase propranolol. Continue current dosage of Relpax. Follow-up in one month or sooner.      Relevant Medications   propranolol ER (INDERAL LA) 80 MG 24 hr capsule     Other   Anxiety state - Primary    Anxiety state appears slightly improved with medication addition although affects of medication do not last throughout the entire day. Discontinue propranolol. Start propranolol long-acting. Continue with counseling and stress/stress management. Follow-up in one month or sooner if needed.      Relevant Medications   propranolol ER (INDERAL LA) 80 MG 24 hr capsule    Other Visit Diagnoses    Encounter for immunization  Relevant Orders   Flu Vaccine QUAD 36+ mos IM (Completed)       I have discontinued Mr. Stingley propranolol. I am also having him start on propranolol ER. Additionally, I am having him maintain his omeprazole and eletriptan.   Meds ordered this encounter  Medications  . propranolol ER (INDERAL LA) 80 MG 24 hr capsule    Sig: Take 1 capsule (80 mg total) by mouth daily.    Dispense:  30 capsule    Refill:  1    Order Specific Question:   Supervising Provider    Answer:   Pricilla Holm A [[id number]]     Follow-up: Return in about 1 month (around 10/18/2015), or if symptoms worsen or fail to improve.  Mauricio Po, FNP

## 2015-09-18 NOTE — Assessment & Plan Note (Signed)
Recently started on propranolol with mild improvement of migraine headaches. Increase propranolol. Continue current dosage of Relpax. Follow-up in one month or sooner.

## 2015-09-18 NOTE — Assessment & Plan Note (Signed)
Anxiety state appears slightly improved with medication addition although affects of medication do not last throughout the entire day. Discontinue propranolol. Start propranolol long-acting. Continue with counseling and stress/stress management. Follow-up in one month or sooner if needed.

## 2015-09-20 MED FILL — ELETRIPTAN HBR 40 MG TABLET: 40 | 90 days supply | Qty: 27 | Fill #0

## 2015-10-01 DIAGNOSIS — F4322 Adjustment disorder with anxiety: Secondary | ICD-10-CM | POA: Diagnosis not present

## 2015-10-17 ENCOUNTER — Ambulatory Visit (INDEPENDENT_AMBULATORY_CARE_PROVIDER_SITE_OTHER): Payer: 59 | Admitting: Family

## 2015-10-17 ENCOUNTER — Encounter: Payer: Self-pay | Admitting: Family

## 2015-10-17 DIAGNOSIS — G629 Polyneuropathy, unspecified: Secondary | ICD-10-CM | POA: Insufficient documentation

## 2015-10-17 MED ORDER — PREDNISONE 20 MG PO TABS: 20.0000 mg | ORAL_TABLET | Freq: Every day | ORAL | 0 refills | Status: DC | PRN

## 2015-10-17 MED ORDER — GABAPENTIN 300 MG PO CAPS: 300.0000 mg | ORAL_CAPSULE | Freq: Every evening | ORAL | 0 refills | Status: DC | PRN

## 2015-10-17 NOTE — Patient Instructions (Signed)
Thank you for choosing [facility].  SUMMARY AND INSTRUCTIONS:  You will receive a call to schedule your neurology appointment.   Medication:  Your prescription(s) have been submitted to your pharmacy or been printed and provided for you. Please take as directed and contact our office if you believe you are having problem(s) with the medication(s) or have any questions.   Follow up:  If your symptoms worsen or fail to improve, please contact our office for further instruction, or in case of emergency go directly to the emergency room at the closest medical facility.

## 2015-10-17 NOTE — Assessment & Plan Note (Signed)
Symptoms and exam concern for transient neuropathies of undetermined origin with normal neurological exam. Start gabapentin and prednisone to determine effectiveness and reducing symptoms. Referral placed to neurology for further assessment including possible nerve conduction study if indicated. Continue to monitor.

## 2015-10-17 NOTE — Progress Notes (Signed)
Subjective:    Patient ID: Adam Dunn, male    DOB: November 25, 1980, 35 y.o.   MRN: [id number]  Chief Complaint  Patient presents with  . shoulder issues    skin felt like it was on fire and very sensitive to touch in upper part of body    HPI:  Adam Dunn is a 35 y.o. male who  has no past medical history on file. and presents today for an office visit.   This is a new problem. Associated symptom of increased sensitivity of his left side that generally starts with his left eye and at its worst it progresses to fire like sensation that goes down his upper extremity down to his fingers. There has been sensations down the left side of the trunk. Frequency of the occurrences is about once per month up to once every 4 months. This has been going on for a couple of years. Symptoms generally last 2-4 days on average. There are no modifying factors that he has attempted. Denies any trauma or orthopedic injury.  No fevers. No numbness or tingling. Most of the sensation is gone and feels like he has a sunburn on the top of his head.    Allergies  Allergen Reactions  . Sulfamethoxazole-Trimethoprim     REACTION: Pt does not remember, was an infant      Outpatient Medications Prior to Visit  Medication Sig Dispense Refill  . eletriptan (RELPAX) 40 MG tablet Take 1 tablet (40 mg total) by mouth as needed for migraine or headache. May repeat in 2 hours if headache persists or recurs. 9 tablet 11  . omeprazole (PRILOSEC OTC) 20 MG tablet Take 1 tablet (20 mg total) by mouth daily as needed. 90 tablet 1  . propranolol ER (INDERAL LA) 80 MG 24 hr capsule Take 1 capsule (80 mg total) by mouth daily. 30 capsule 1   No facility-administered medications prior to visit.       Past Surgical History:  Procedure Laterality Date  . HERNIA REPAIR     infant     No past medical history on file.   Review of Systems  Constitutional: Negative for chills and fever.  Eyes: Negative for visual  disturbance.  Respiratory: Negative for chest tightness and shortness of breath.   Cardiovascular: Negative for chest pain, palpitations and leg swelling.  Neurological: Negative for dizziness, weakness and numbness.      Objective:    BP 122/72 (BP Location: Left Arm, Patient Position: Sitting, Cuff Size: Large)   Pulse (!) 56   Temp 98.2 F (36.8 C) (Oral)   Resp 16   Ht 5' 6"  (1.676 m)   Wt 253 lb (114.8 kg)   SpO2 96%   BMI 40.84 kg/m  Nursing note and vital signs reviewed.  Physical Exam  Constitutional: He is oriented to person, place, and time. He appears well-developed and well-nourished. No distress.  Eyes: Conjunctivae and EOM are normal. Pupils are equal, round, and reactive to light.  Cardiovascular: Normal rate, regular rhythm, normal heart sounds and intact distal pulses.   Pulmonary/Chest: Effort normal and breath sounds normal.  Neurological: He is alert and oriented to person, place, and time. He has normal reflexes.  Skin: Skin is warm and dry.  Psychiatric: He has a normal mood and affect. His behavior is normal. Judgment and thought content normal.       Assessment & Plan:   Problem List Items Addressed This Visit  Nervous and Auditory   Neuropathy ([place])    Symptoms and exam concern for transient neuropathies of undetermined origin with normal neurological exam. Start gabapentin and prednisone to determine effectiveness and reducing symptoms. Referral placed to neurology for further assessment including possible nerve conduction study if indicated. Continue to monitor.      Relevant Medications   predniSONE (DELTASONE) 20 MG tablet   gabapentin (NEURONTIN) 300 MG capsule   Other Relevant Orders   Ambulatory referral to Neurology    Other Visit Diagnoses   None.      I am having Mr. Hulse start on predniSONE and gabapentin. I am also having him maintain his omeprazole, eletriptan, and propranolol ER.   Meds ordered this encounter    Medications  . predniSONE (DELTASONE) 20 MG tablet    Sig: Take 1 tablet (20 mg total) by mouth daily as needed.    Dispense:  5 tablet    Refill:  0    Order Specific Question:   Supervising Provider    Answer:   Pricilla Holm A [[id number]]  . gabapentin (NEURONTIN) 300 MG capsule    Sig: Take 1 capsule (300 mg total) by mouth at bedtime as needed.    Dispense:  5 capsule    Refill:  0    Order Specific Question:   Supervising Provider    Answer:   Pricilla Holm A [[id number]]     Follow-up: Return if symptoms worsen or fail to improve.  Mauricio Po, FNP

## 2015-10-25 MED FILL — PROPRANOLOL ER 80 MG CAP: 80 | 30 days supply | Qty: 30 | Fill #1

## 2015-10-29 ENCOUNTER — Encounter: Payer: Self-pay | Admitting: Family

## 2015-10-29 DIAGNOSIS — F4322 Adjustment disorder with anxiety: Secondary | ICD-10-CM | POA: Diagnosis not present

## 2015-11-13 ENCOUNTER — Encounter: Payer: Self-pay | Admitting: Diagnostic Neuroimaging

## 2015-11-13 ENCOUNTER — Ambulatory Visit (INDEPENDENT_AMBULATORY_CARE_PROVIDER_SITE_OTHER): Payer: 59 | Admitting: Diagnostic Neuroimaging

## 2015-11-13 VITALS — BP 130/86 | HR 64 | Ht 66.0 in | Wt 261.2 lb

## 2015-11-13 DIAGNOSIS — Z6841 Body Mass Index (BMI) 40.0 and over, adult: Secondary | ICD-10-CM | POA: Insufficient documentation

## 2015-11-13 DIAGNOSIS — G43109 Migraine with aura, not intractable, without status migrainosus: Secondary | ICD-10-CM | POA: Diagnosis not present

## 2015-11-13 DIAGNOSIS — R208 Other disturbances of skin sensation: Secondary | ICD-10-CM

## 2015-11-13 DIAGNOSIS — R0683 Snoring: Secondary | ICD-10-CM | POA: Diagnosis not present

## 2015-11-13 DIAGNOSIS — R202 Paresthesia of skin: Secondary | ICD-10-CM | POA: Diagnosis not present

## 2015-11-13 NOTE — Progress Notes (Signed)
[facility]  PATIENT: Adam Dunn DOB: Mar 07, 1980  REFERRING CLINICIAN: G Calone HISTORY FROM: patient  REASON FOR VISIT: new consult    HISTORICAL  CHIEF COMPLAINT:  Chief Complaint  Patient presents with  . Neuropathy    rm 7, New Pt, "nerve pain primarily on left side x 1 year; skin sensitive, on fire"    HISTORY OF PRESENT ILLNESS:   35 year old male here for valuation of abnormal sensations. For past 4 years patient has had intermittent burning and sensitive sensation in his left arm, spreading to the left face, left scalp and left body. Symptoms can last hours at a time. Over the years having been increasing in severity and frequency. Now symptoms can last 1 day up to 1 week at a time. Sometimes his right side is affected. He always notes a unilateral distribution of his symptoms. No associated factors. No triggering factors. Lately symptoms of becoming more painful as if he has burned his skin.  For past 10 years patient has had a history of migraine headaches with grabbing and painful sensation the base of the skull, neck, then pulsating and throbbing sensation in his head. Headaches awaken him up from sleep. He averages 1 day per month. Sometimes preceded by flashing light sensation. Sometimes associated with photophobia, phonophobia and motion sensitivity. Sometimes associated with nausea. Patient uses Relpax which helps alleviate headaches. He is also on propranolol for anxiety management, and also for migraine prevention.   REVIEW OF SYSTEMS: Full 14 system review of systems performed and negative with exception of: Fatigue cough snoring skin sensitivity anxiety disinterest in activities restless legs headache weakness. History of sleep study 2011 apparently negative.  ALLERGIES: Allergies  Allergen Reactions  . Sulfamethoxazole-Trimethoprim     REACTION: Pt does not remember, was an infant    HOME MEDICATIONS: Outpatient Medications Prior to  Visit  Medication Sig Dispense Refill  . eletriptan (RELPAX) 40 MG tablet Take 1 tablet (40 mg total) by mouth as needed for migraine or headache. May repeat in 2 hours if headache persists or recurs. 9 tablet 11  . omeprazole (PRILOSEC OTC) 20 MG tablet Take 1 tablet (20 mg total) by mouth daily as needed. 90 tablet 1  . propranolol ER (INDERAL LA) 80 MG 24 hr capsule Take 1 capsule (80 mg total) by mouth daily. 30 capsule 1  . gabapentin (NEURONTIN) 300 MG capsule Take 1 capsule (300 mg total) by mouth at bedtime as needed. (Patient not taking: Reported on 11/13/2015) 5 capsule 0  . predniSONE (DELTASONE) 20 MG tablet Take 1 tablet (20 mg total) by mouth daily as needed. (Patient not taking: Reported on 11/13/2015) 5 tablet 0   No facility-administered medications prior to visit.     PAST MEDICAL HISTORY: Past Medical History:  Diagnosis Date  . Migraines     PAST SURGICAL HISTORY: Past Surgical History:  Procedure Laterality Date  . HERNIA REPAIR     infant    FAMILY HISTORY: Family History  Problem Relation Age of Onset  . Hypertension Mother   . Migraines Mother   . Fibromyalgia Mother   . Arthritis Mother   . Healthy Father   . Healthy Brother   . Coronary artery disease Maternal Grandfather   . Emphysema Maternal Grandfather   . Colon cancer Neg Hx   . Prostate cancer Neg Hx     SOCIAL HISTORY:  Social History   Social History  . Marital status: Married    Spouse name: Janett Billow  .  Number of children: 3  . Years of education: 83   Occupational History  . Realtor     self employed   Social History Main Topics  . Smoking status: Never Smoker  . Smokeless tobacco: Never Used     Comment: 11/13/15 very very occas cigar  . Alcohol use Yes     Comment: 11/13/15 1 a week  . Drug use: No  . Sexual activity: Not on file   Other Topics Concern  . Not on file   Social History Narrative   Lives at home with wife, children   Caffeine- 2-3 daily-coffee, soda      PHYSICAL EXAM  GENERAL EXAM/CONSTITUTIONAL: Vitals:  Vitals:   11/13/15 1252  BP: 130/86  Pulse: 64  Weight: 261 lb 3.2 oz (118.5 kg)  Height: 5' 6"  (1.676 m)     Body mass index is 42.16 kg/m.  Visual Acuity Screening   Right eye Left eye Both eyes  Without correction: 20/20 20/20   With correction:        Patient is in no distress; well developed, nourished and groomed; neck is supple  CARDIOVASCULAR:  Examination of carotid arteries is normal; no carotid bruits  Regular rate and rhythm, no murmurs  Examination of peripheral vascular system by observation and palpation is normal  EYES:  Ophthalmoscopic exam of optic discs and posterior segments is normal; no papilledema or hemorrhages  MUSCULOSKELETAL:  Gait, strength, tone, movements noted in Neurologic exam below  NEUROLOGIC: MENTAL STATUS:  No flowsheet data found.  awake, alert, oriented to person, place and time  recent and remote memory intact  normal attention and concentration  language fluent, comprehension intact, naming intact,   fund of knowledge appropriate  CRANIAL NERVE:   2nd - no papilledema on fundoscopic exam  2nd, 3rd, 4th, 6th - pupils equal and reactive to light, visual fields full to confrontation, extraocular muscles intact, no nystagmus  5th - facial sensation symmetric  7th - facial strength symmetric  8th - hearing intact  9th - palate elevates symmetrically, uvula midline  11th - shoulder shrug symmetric  12th - tongue protrusion midline  MOTOR:   normal bulk and tone, full strength in the BUE, BLE  SENSORY:   normal and symmetric to light touch, temperature, vibration  COORDINATION:   finger-nose-finger, fine finger movements normal  REFLEXES:   deep tendon reflexes present and symmetric  GAIT/STATION:   narrow based gait; able to walk tandem; romberg is negative    DIAGNOSTIC DATA (LABS, IMAGING, TESTING) - I reviewed patient  records, labs, notes, testing and imaging myself where available.  No results found for: WBC, HGB, HCT, MCV, PLT    Component Value Date/Time   GLUCOSE 97 09/13/2007 1237   Lab Results  Component Value Date   CHOL 162 09/13/2007   HDL 39.4 09/13/2007   LDLCALC 113 (H) 09/13/2007   TRIG 49 09/13/2007   CHOLHDL 4.1 CALC 09/13/2007   No results found for: HGBA1C No results found for: VITAMINB12 No results found for: TSH       ASSESSMENT AND PLAN  35 y.o. year old male here with Intermittent episodes of unilateral burning sensation and paresthesias, without specific triggering factors. Symptoms last 12 hours up to 1 week at a time. Also has history of migraine headaches with aura. Burning sensation paresthesias could be related to migraine phenomenon. Also could be related to other central nervous system or peripheral nervous system etiologies, stress, or metabolic causes.   Ddx:  migraine phenomenon vs CNS autoimmune, inflamm, vascular  1. Burning sensation   2. Paresthesia   3. Migraine with aura and without status migrainosus, not intractable   4. Snoring   5. BMI 40.0-44.9, adult [facility])      PLAN: - check MRI brain - check labs - consider increasing propranolol to 66m BID (will discuss with PCP) - if cannot increase propranolol, then may consider topiramate for migraine prevention - discontinue gabapentin and prednisone (patient not tried these yet) - refer to sleep study consult (obesity, snoring, daytime fatigue; last home sleep study in 2011; AHI was 5)  Orders Placed This Encounter  Procedures  . MR BRAIN W WO CONTRAST  . Vitamin B12  . Hemoglobin A1c  . TSH  . Ambulatory referral to Sleep Studies   Return in about 3 months (around 02/13/2016).    VPenni Bombard MD 120/09/1978 12:21PM Certified in Neurology, Neurophysiology and Neuroimaging  [facility] [address] [place] [phone]

## 2015-11-13 NOTE — Patient Instructions (Signed)
Thank you for coming to see [place] at [facility]. I hope we have been able to provide you high quality care today.  You may receive a patient satisfaction survey over the next few weeks. We would appreciate your feedback and comments so that we may continue to improve ourselves and the health of our patients.  - I will check MRI brain and labs  - I will setup sleep study consult  - consider increasing propronolol    ~~~~~~~~~~~~~~~~~~~~~~~~~~~~~~~~~~~~~~~~~~~~~~~~~~~~~~~~~~~~~~~~~  DR. PENUMALLI'S GUIDE TO HAPPY AND HEALTHY LIVING These are some of my general health and wellness recommendations. Some of them may apply to you better than others. Please use common sense as you try these suggestions and feel free to ask me any questions.   ACTIVITY/FITNESS Mental, social, emotional and physical stimulation are very important for brain and body health. Try learning a new activity (arts, music, language, sports, games).  Keep moving your body to the best of your abilities. You can do this at home, inside or outside, the park, community center, gym or anywhere you like. Consider a physical therapist or personal trainer to get started. Consider the app Sworkit. Fitness trackers such as smart-watches, smart-phones or Fitbits can help as well.   NUTRITION Eat more plants: colorful vegetables, nuts, seeds and berries.  Eat less sugar, salt, preservatives and processed foods.  Avoid toxins such as cigarettes and alcohol.  Drink water when you are thirsty. Warm water with a slice of lemon is an excellent morning drink to start the day.  Consider these websites for more information The Nutrition Source (https://www.henry-hernandez.biz/) Precision Nutrition (WindowBlog.ch)   RELAXATION Consider practicing mindfulness meditation or other relaxation techniques such as deep breathing, prayer, yoga, tai chi, massage. See website  mindful.org or the apps Headspace or Calm to help get started.   SLEEP Try to get at least 7-8+ hours sleep per day. Regular exercise and reduced caffeine will help you sleep better. Practice good sleep hygeine techniques. See website sleep.org for more information.   PLANNING Prepare estate planning, living will, healthcare POA documents. Sometimes this is best planned with the help of an attorney. Theconversationproject.org and agingwithdignity.org are excellent resources.

## 2015-11-14 ENCOUNTER — Ambulatory Visit (INDEPENDENT_AMBULATORY_CARE_PROVIDER_SITE_OTHER): Payer: 59 | Admitting: Family

## 2015-11-14 ENCOUNTER — Telehealth: Payer: Self-pay | Admitting: *Deleted

## 2015-11-14 ENCOUNTER — Encounter: Payer: Self-pay | Admitting: Family

## 2015-11-14 VITALS — BP 118/74 | HR 69 | Temp 97.5°F | Resp 18 | Ht 66.0 in | Wt 261.0 lb

## 2015-11-14 DIAGNOSIS — G43109 Migraine with aura, not intractable, without status migrainosus: Secondary | ICD-10-CM | POA: Diagnosis not present

## 2015-11-14 DIAGNOSIS — J4520 Mild intermittent asthma, uncomplicated: Secondary | ICD-10-CM | POA: Diagnosis not present

## 2015-11-14 DIAGNOSIS — F411 Generalized anxiety disorder: Secondary | ICD-10-CM | POA: Diagnosis not present

## 2015-11-14 LAB — HEMOGLOBIN A1C
ESTIMATED AVERAGE GLUCOSE: 105 mg/dL
Hgb A1c MFr Bld: 5.3 % (ref 4.8–5.6)

## 2015-11-14 LAB — VITAMIN B12: Vitamin B-12: 485 pg/mL (ref 211–946)

## 2015-11-14 LAB — TSH: TSH: 2.75 u[IU]/mL (ref 0.450–4.500)

## 2015-11-14 MED ORDER — PROMETHAZINE-DM 6.25-15 MG/5ML PO SYRP: 5.0000 mL | ORAL_SOLUTION | Freq: Every day | ORAL | 0 refills | Status: DC

## 2015-11-14 MED ORDER — OMEPRAZOLE MAGNESIUM 20 MG PO TBEC: 20.0000 mg | DELAYED_RELEASE_TABLET | Freq: Every day | ORAL | 1 refills | Status: DC | PRN

## 2015-11-14 MED ORDER — PROPRANOLOL HCL ER 120 MG PO CP24: 120.0000 mg | ORAL_CAPSULE | Freq: Every day | ORAL | 1 refills | Status: DC

## 2015-11-14 MED FILL — OMEPRAZOLE DR 20 MG CAPSULE: 20 | 90 days supply | Qty: 90 | Fill #0

## 2015-11-14 NOTE — Patient Instructions (Addendum)
Thank you for choosing [facility].  SUMMARY AND INSTRUCTIONS:  Medication:  Increase propranolol to 120 mg. Monitor heart rate and blood pressure. Watch for signs of dizziness or weakness.   Your prescription(s) have been submitted to your pharmacy or been printed and provided for you. Please take as directed and contact our office if you believe you are having problem(s) with the medication(s) or have any questions.   Follow up:  If your symptoms worsen or fail to improve, please contact our office for further instruction, or in case of emergency go directly to the emergency room at the closest medical facility.    General Recommendations:    Please drink plenty of fluids.  Get plenty of rest   Sleep in humidified air  Use saline nasal sprays  Netti pot   OTC Medications:  Decongestants - helps relieve congestion   Flonase (generic fluticasone) or Nasacort (generic triamcinolone) - please make sure to use the "cross-over" technique at a 45 degree angle towards the opposite eye as opposed to straight up the nasal passageway.   Sudafed (generic pseudoephedrine - Note this is the one that is available behind the pharmacy counter); Products with phenylephrine (-PE) may also be used but is often not as effective as pseudoephedrine.   If you have HIGH BLOOD PRESSURE - Coricidin HBP; AVOID any product that is -D as this contains pseudoephedrine which may increase your blood pressure.  Afrin (oxymetazoline) every 6-8 hours for up to 3 days.   Allergies - helps relieve runny nose, itchy eyes and sneezing   Claritin (generic loratidine), Allegra (fexofenidine), or Zyrtec (generic cyrterizine) for runny nose. These medications should not cause drowsiness.  Note - Benadryl (generic diphenhydramine) may be used however may cause drowsiness  Cough -   Delsym or Robitussin (generic dextromethorphan)  Expectorants - helps loosen mucus to ease removal   Mucinex  (generic guaifenesin) as directed on the package.  Headaches / General Aches   Tylenol (generic acetaminophen) - DO NOT EXCEED 3 grams (3,000 mg) in a 24 hour time period  Advil/Motrin (generic ibuprofen)   Sore Throat -   Salt water gargle   Chloraseptic (generic benzocaine) spray or lozenges / Sucrets (generic dyclonine)

## 2015-11-14 NOTE — Assessment & Plan Note (Addendum)
Symptoms and exam consistent with reactive airway disease most likely a result of his most recent cold. Treat conservatively with over the counter medications for symptom relief and supportive care.  Sample of Breo provided as needed. Given multiple episodes of similar symptoms consider pulmonary function testing for asthma. Follow up if symptoms worsen or do not improve.

## 2015-11-14 NOTE — Progress Notes (Signed)
Subjective:    Patient ID: Adam Dunn, male    DOB: 12/14/1980, 35 y.o.   MRN: [id number]  Chief Complaint  Patient presents with  . Cough    cough that he has had for a week and a half, follow up on BP medication    HPI:  Adam Dunn is a 35 y.o. male who  has a past medical history of Migraines. and presents today for an office visit.   1.) Cough - This is a new problem. Associated symptom of a cough has been going on for about 1.5 weeks. No fevers. Described as non-productive and the severity is enough to disturb his sleep. Timing of the symptoms is generally worse at night. Recently had a cold within the last week. Modifying factors include Tessalon which did not help. Course of the cough has stayed about the same.   2.)  Migraines - Currently maintained on Relpax and propranolol. Reports taking the medication as prescribed and denies adverse side effects. Indicates continues to experience headaches 1-2 times per month with no significant improvements noted with the start of propanolol. Recently evaluated by neurology with the suggestion to increase propranolol or possible start of Topamax.    Allergies  Allergen Reactions  . Sulfamethoxazole-Trimethoprim     REACTION: Pt does not remember, was an infant      Outpatient Medications Prior to Visit  Medication Sig Dispense Refill  . eletriptan (RELPAX) 40 MG tablet Take 1 tablet (40 mg total) by mouth as needed for migraine or headache. May repeat in 2 hours if headache persists or recurs. 9 tablet 11  . gabapentin (NEURONTIN) 300 MG capsule Take 1 capsule (300 mg total) by mouth at bedtime as needed. 5 capsule 0  . omeprazole (PRILOSEC OTC) 20 MG tablet Take 1 tablet (20 mg total) by mouth daily as needed. 90 tablet 1  . predniSONE (DELTASONE) 20 MG tablet Take 1 tablet (20 mg total) by mouth daily as needed. 5 tablet 0  . propranolol ER (INDERAL LA) 80 MG 24 hr capsule Take 1 capsule (80 mg total) by mouth daily. 30 capsule  1   No facility-administered medications prior to visit.       Past Surgical History:  Procedure Laterality Date  . HERNIA REPAIR     infant      Past Medical History:  Diagnosis Date  . Migraines       Review of Systems  Constitutional: Negative for chills and fever.  HENT: Negative for congestion, ear pain, sinus pressure and sore throat.   Respiratory: Positive for cough. Negative for chest tightness, shortness of breath and wheezing.   Neurological: Positive for headaches.      Objective:    BP 118/74 (BP Location: Left Arm, Patient Position: Sitting, Cuff Size: Large)   Pulse 69   Temp 97.5 F (36.4 C) (Oral)   Resp 18   Ht 5' 6"  (1.676 m)   Wt 261 lb (118.4 kg)   SpO2 95%   BMI 42.13 kg/m  Nursing note and vital signs reviewed.  Physical Exam  Constitutional: He is oriented to person, place, and time. He appears well-developed and well-nourished. No distress.  HENT:  Right Ear: Hearing, tympanic membrane, external ear and ear canal normal. No decreased hearing is noted.  Left Ear: Hearing, tympanic membrane, external ear and ear canal normal.  Nose: Nose normal. Right sinus exhibits no maxillary sinus tenderness and no frontal sinus tenderness. Left sinus exhibits no maxillary  sinus tenderness and no frontal sinus tenderness.  Mouth/Throat: Uvula is midline, oropharynx is clear and moist and mucous membranes are normal.  Cardiovascular: Normal rate, regular rhythm, normal heart sounds and intact distal pulses.   Pulmonary/Chest: Effort normal and breath sounds normal.  Neurological: He is alert and oriented to person, place, and time.  Skin: Skin is warm and dry.  Psychiatric: He has a normal mood and affect. His behavior is normal. Judgment and thought content normal.       Assessment & Plan:   Problem List Items Addressed This Visit      Cardiovascular and Mediastinum   Migraine with aura and without status migrainosus, not intractable - Primary      Continues to experience migraine headaches that does not appear to helping. Heart rate is stable to trial increased dose of propranolol. Recommend monitor blood pressure and pulse at home. If symptoms develop discontinue propranolol and per neurology recommend starting topamax.       Relevant Medications   propranolol ER (INDERAL LA) 120 MG 24 hr capsule     Respiratory   Reactive airway disease    Symptoms and exam consistent with reactive airway disease most likely a result of his most recent cold. Treat conservatively with over the counter medications for symptom relief and supportive care.  Sample of Breo provided as needed. Given multiple episodes of similar symptoms consider pulmonary function testing for asthma. Follow up if symptoms worsen or do not improve.         Other   Anxiety state   Relevant Medications   propranolol ER (INDERAL LA) 120 MG 24 hr capsule       I have discontinued Mr. Helfand predniSONE and gabapentin. I have also changed his propranolol ER. Additionally, I am having him maintain his eletriptan, omeprazole, and promethazine-dextromethorphan.   Meds ordered this encounter  Medications  . omeprazole (PRILOSEC OTC) 20 MG tablet    Sig: Take 1 tablet (20 mg total) by mouth daily as needed.    Dispense:  90 tablet    Refill:  1  . propranolol ER (INDERAL LA) 120 MG 24 hr capsule    Sig: Take 1 capsule (120 mg total) by mouth daily.    Dispense:  30 capsule    Refill:  1    Order Specific Question:   Supervising Provider    Answer:   Pricilla Holm A [[id number]]  . DISCONTD: promethazine-dextromethorphan (PROMETHAZINE-DM) 6.25-15 MG/5ML syrup    Sig: Take 5 mLs by mouth at bedtime.    Dispense:  118 mL    Refill:  0    Order Specific Question:   Supervising Provider    Answer:   Pricilla Holm A [[id number]]  . promethazine-dextromethorphan (PROMETHAZINE-DM) 6.25-15 MG/5ML syrup    Sig: Take 5 mLs by mouth at bedtime.    Dispense:  118 mL     Refill:  0    Order Specific Question:   Supervising Provider    Answer:   Pricilla Holm A [[id number]]     Follow-up: Return if symptoms worsen or fail to improve.  Adam Po, FNP

## 2015-11-14 NOTE — Telephone Encounter (Signed)
Per Dr Leta Baptist, spoke with patient and informed him his lab results are normal. He has sleep consult on 12/10/15. He verbalized understanding, appreciation.

## 2015-11-14 NOTE — Assessment & Plan Note (Signed)
Continues to experience migraine headaches that does not appear to helping. Heart rate is stable to trial increased dose of propranolol. Recommend monitor blood pressure and pulse at home. If symptoms develop discontinue propranolol and per neurology recommend starting topamax.

## 2015-11-26 DIAGNOSIS — F4322 Adjustment disorder with anxiety: Secondary | ICD-10-CM | POA: Diagnosis not present

## 2015-11-27 ENCOUNTER — Ambulatory Visit (INDEPENDENT_AMBULATORY_CARE_PROVIDER_SITE_OTHER): Payer: 59

## 2015-11-27 DIAGNOSIS — G43109 Migraine with aura, not intractable, without status migrainosus: Secondary | ICD-10-CM

## 2015-11-27 DIAGNOSIS — R208 Other disturbances of skin sensation: Secondary | ICD-10-CM | POA: Diagnosis not present

## 2015-11-27 DIAGNOSIS — R202 Paresthesia of skin: Secondary | ICD-10-CM

## 2015-12-07 ENCOUNTER — Encounter: Payer: Self-pay | Admitting: Family

## 2015-12-09 DIAGNOSIS — F4322 Adjustment disorder with anxiety: Secondary | ICD-10-CM | POA: Diagnosis not present

## 2015-12-09 MED ORDER — AMOXICILLIN-POT CLAVULANATE 875-125 MG PO TABS: 1.0000 | ORAL_TABLET | Freq: Two times a day (BID) | ORAL | 0 refills | Status: DC

## 2015-12-09 MED FILL — AMOX-CLAV 875-125 MG TABLET: 875-125 | 10 days supply | Qty: 20 | Fill #0

## 2015-12-10 ENCOUNTER — Encounter: Payer: Self-pay | Admitting: Neurology

## 2015-12-10 ENCOUNTER — Ambulatory Visit (INDEPENDENT_AMBULATORY_CARE_PROVIDER_SITE_OTHER): Payer: 59 | Admitting: Neurology

## 2015-12-10 VITALS — BP 128/82 | HR 60 | Resp 20 | Ht 66.0 in | Wt 266.0 lb

## 2015-12-10 DIAGNOSIS — G4733 Obstructive sleep apnea (adult) (pediatric): Secondary | ICD-10-CM

## 2015-12-10 DIAGNOSIS — G2581 Restless legs syndrome: Secondary | ICD-10-CM | POA: Diagnosis not present

## 2015-12-10 DIAGNOSIS — R51 Headache: Secondary | ICD-10-CM

## 2015-12-10 DIAGNOSIS — R0683 Snoring: Secondary | ICD-10-CM | POA: Diagnosis not present

## 2015-12-10 DIAGNOSIS — Z6841 Body Mass Index (BMI) 40.0 and over, adult: Secondary | ICD-10-CM | POA: Diagnosis not present

## 2015-12-10 DIAGNOSIS — R519 Headache, unspecified: Secondary | ICD-10-CM

## 2015-12-10 NOTE — Progress Notes (Addendum)
Subjective:    Patient ID: Adam Dunn is a 35 y.o. male.  HPI     Star Age, MD, PhD [facility] [address], [address]O. [place], [place] [address]  Dear Bonnita Levan,   I saw your patient, Adam Dunn, upon your kind request in my clinic today for initial consultation of his sleep disorder, in particular, concern for underlying obstructive sleep apnea. The patient is unaccompanied today. As you know, Adam Dunn is a 35 year old right-handed gentleman with an underlying medical history of migraine headaches, paresthesias, anxiety, reflux disease and morbid obesity, who reports snoring and excessive daytime somnolence as well as recurrent morning headaches. His Epworth sleepiness score is 8 out of 24 today, his fatigue score is 26 out of 63. I reviewed your office note from 11/13/2015. The patient is married and lives with his wife and 3 children. He works full-time. He is a nonsmoker. He drinks alcohol occasionally. He drinks caffeine daily in the form of coffee or soda, up to 3 per day. He states he had a sleep study some 6 years ago which per his report was borderline. I reviewed his home sleep test report from 11/06/2009, which showed an AHI of 5 per hour, O2 nadir was 67%, mean in an effort, time below 90% saturation was 35 minutes for the night. Total testing time was 13 hours and 5 minutes which is unusual. He has gained about 15 pounds since his home sleep test some 6 years ago and weight has been fluctuating quite a bit as he can lose weight when he really tries hard and has lost down to the mid 170s one time in the past 5 years. He has 3 young children, ages 7, 60 and 7 yo. Typical bedtime is around midnight, wakeup time around 7- 7:15 AM. He has occasional morning headaches. His migraines often happen in the first part of the day and start at the base of his head or posterior neck area. He had a recent brain MRI which showed benign findings. He has had  intermittent paresthesias in the upper extremities, left more than right. He does have intermittent restless leg symptoms, is not sure if he twitches or kicks his legs in his sleep. He does report sleep disruption, he denies nocturia.  His Past Medical History Is Significant For: Past Medical History:  Diagnosis Date  . Migraines     His Past Surgical History Is Significant For: Past Surgical History:  Procedure Laterality Date  . HERNIA REPAIR     infant    His Family History Is Significant For: Family History  Problem Relation Age of Onset  . Hypertension Mother   . Migraines Mother   . Fibromyalgia Mother   . Arthritis Mother   . Healthy Father   . Healthy Brother   . Coronary artery disease Maternal Grandfather   . Emphysema Maternal Grandfather   . Colon cancer Neg Hx   . Prostate cancer Neg Hx     His Social History Is Significant For: Social History   Social History  . Marital status: Married    Spouse name: Janett Billow  . Number of children: 3  . Years of education: 9   Occupational History  . Realtor     self employed   Social History Main Topics  . Smoking status: Never Smoker  . Smokeless tobacco: Never Used     Comment: 11/13/15 very very occas cigar  . Alcohol use Yes  Comment: 11/13/15 1 a week  . Drug use: No  . Sexual activity: Not Asked   Other Topics Concern  . None   Social History Narrative   Lives at home with wife, children   Caffeine- 2-3 daily-coffee, soda    His Allergies Are:  Allergies  Allergen Reactions  . Sulfamethoxazole-Trimethoprim     REACTION: Pt does not remember, was an infant  :   His Current Medications Are:  Outpatient Encounter Prescriptions as of 12/10/2015  Medication Sig  . amoxicillin-clavulanate (AUGMENTIN) 875-125 MG tablet Take 1 tablet by mouth 2 (two) times daily.  Marland Kitchen eletriptan (RELPAX) 40 MG tablet Take 1 tablet (40 mg total) by mouth as needed for migraine or headache. May repeat in 2 hours if  headache persists or recurs.  Marland Kitchen omeprazole (PRILOSEC OTC) 20 MG tablet Take 1 tablet (20 mg total) by mouth daily as needed.  . propranolol ER (INDERAL LA) 120 MG 24 hr capsule Take 1 capsule (120 mg total) by mouth daily.  . [DISCONTINUED] promethazine-dextromethorphan (PROMETHAZINE-DM) 6.25-15 MG/5ML syrup Take 5 mLs by mouth at bedtime.   No facility-administered encounter medications on file as of 12/10/2015.   :  Review of Systems:  Out of a complete 14 point review of systems, all are reviewed and negative with the exception of these symptoms as listed below: Review of Systems  Neurological: Positive for weakness and headaches.       Pt presents today for a sleep consult to discuss snoring and a possible sleep study. Pt has had a sleep study 6 years ago, and it was "borderline." Pt has not been treated with a CPAP before.  Epworth Sleepiness Scale 0= would never doze 1= slight chance of dozing 2= moderate chance of dozing 3= high chance of dozing  Sitting and reading: 1 Watching TV: 1 Sitting inactive in a public place (ex. Theater or meeting): 1 As a passenger in a car for an hour without a break: 1 Lying down to rest in the afternoon: 3 Sitting and talking to someone: 0 Sitting quietly after lunch (no alcohol): 1 In a car, while stopped in traffic: 0 Total: 8     Objective:  Neurologic Exam  Physical Exam Physical Examination:   Vitals:   12/10/15 1111  BP: 128/82  Pulse: 60  Resp: 20    General Examination: The patient is a very pleasant 35 y.o. male in no acute distress. He appears well-developed and well-nourished and well groomed.   HEENT: Normocephalic, atraumatic, pupils are equal, round and reactive to light and accommodation. Extraocular tracking is good without limitation to gaze excursion or nystagmus noted. Normal smooth pursuit is noted. Hearing is grossly intact. Tympanic membranes are clear bilaterally. Face is symmetric with normal facial animation  and normal facial sensation. Speech is clear with no dysarthria noted. There is no hypophonia. There is no lip, neck/head, jaw or voice tremor. Neck is supple with full range of passive and active motion. There are no carotid bruits on auscultation. Oropharynx exam reveals: mild mouth dryness, good dental hygiene and moderate airway crowding, due to smaller airway entry and larger uvula, tonsils of 1+. Mallampati is class II. Tongue protrudes centrally and palate elevates symmetrically. Neck size is 17 1/8 inches. He has a Mild overbite. Nasal inspection reveals no significant nasal mucosal bogginess or redness and no septal deviation.   Chest: Clear to auscultation without wheezing, rhonchi or crackles noted.  Heart: S1+S2+0, regular and normal without murmurs, rubs or gallops noted.  Abdomen: Soft, non-tender and non-distended with normal bowel sounds appreciated on auscultation.  Extremities: There is no pitting edema in the distal lower extremities bilaterally. Pedal pulses are intact.  Skin: Warm and dry without trophic changes noted. There are no varicose veins.  Musculoskeletal: exam reveals no obvious joint deformities, tenderness or joint swelling or erythema.   Neurologically:  Mental status: The patient is awake, alert and oriented in all 4 spheres. His immediate and remote memory, attention, language skills and fund of knowledge are appropriate. There is no evidence of aphasia, agnosia, apraxia or anomia. Speech is clear with normal prosody and enunciation. Thought process is linear. Mood is normal and affect is normal.  Cranial nerves II - XII are as described above under HEENT exam. In addition: shoulder shrug is normal with equal shoulder height noted. Motor exam: Normal bulk, strength and tone is noted. There is no drift, tremor or rebound. Romberg is negative. Reflexes are 1+ throughout. Fine motor skills and coordination: intact with normal finger taps, normal hand movements,  normal rapid alternating patting, normal foot taps and normal foot agility.  Cerebellar testing: No dysmetria or intention tremor on finger to nose testing. Heel to shin is unremarkable bilaterally. There is no truncal or gait ataxia.  Sensory exam: intact to light touch, pinprick, vibration, temperature sense in the upper and lower extremities.  Gait, station and balance: He stands easily. No veering to one side is noted. No leaning to one side is noted. Posture is age-appropriate and stance is narrow based. Gait shows normal stride length and normal pace. No problems turning are noted. Tandem walk is unremarkable.  Assessment and Plan:  In summary, Adam Dunn is a very pleasant 35 y.o.-year old male  with an underlying medical history of migraine headaches, paresthesias, anxiety, reflux disease and morbid obesity, who was previously diagnosed with mild or borderline obstructive sleep apnea some 6 years ago but has since then gained about 15 pounds. His weight has been fluctuating quite a bit. He is on the higher side of his weight spectrum at this time. His history and physical exam are indeed concerning for obstructive sleep apnea and he also endorses intermittent restless leg syndrome type symptoms.  I had a long chat with the patient about my findings and the diagnosis of OSA, its prognosis and treatment options. We talked about medical treatments, surgical interventions and non-pharmacological approaches. I explained in particular the risks and ramifications of untreated moderate to severe OSA, especially with respect to developing cardiovascular disease down the Road, including congestive heart failure, difficult to treat hypertension, cardiac arrhythmias, or stroke. Even type 2 diabetes has, in part, been linked to untreated OSA. Symptoms of untreated OSA include daytime sleepiness, memory problems, mood irritability and mood disorder such as depression and anxiety, lack of energy, as well as  recurrent headaches, especially morning headaches. We talked about rying to maintain a healthy lifestyle in general, as well as the importance of weight control. I encouraged the patient to eat healthy, exercise daily and keep well hydrated, to keep a scheduled bedtime and wake time routine, to not skip any meals and eat healthy snacks in between meals. I advised the patient not to drive when feeling sleepy. I recommended the following at this time: sleep study with potential positive airway pressure titration. (We will score hypopneas at 3% and split the sleep study into diagnostic and treatment portion, if the estimated. 2 hour AHI is >15/h).   I explained the sleep test procedure  to the patient and also outlined possible surgical and non-surgical treatment options of OSA, including the use of a custom-made dental device (which would require a referral to a specialist dentist or oral surgeon), upper airway surgical options, such as pillar implants, radiofrequency surgery, tongue base surgery, and UPPP (which would involve a referral to an ENT surgeon). Rarely, jaw surgery such as mandibular advancement may be considered.  I also explained the CPAP treatment option to the patient, who indicated that he would be willing to try CPAP if the need arises. I explained the importance of being compliant with PAP treatment, not only for insurance purposes but primarily to improve His symptoms, and for the patient's long term health benefit, including to reduce His cardiovascular risks. I answered all his questions today and the patient was in agreement. I would like to see him back after the sleep study is completed and encouraged him to call with any interim questions, concerns, problems or updates.   Thank you very much for allowing me to participate in the care of this nice patient. If I can be of any further assistance to you please do not hesitate to talk to me.  Sincerely,   Star Age, MD, PhD

## 2015-12-10 NOTE — Patient Instructions (Signed)

## 2015-12-11 ENCOUNTER — Telehealth: Payer: Self-pay | Admitting: *Deleted

## 2015-12-11 NOTE — Telephone Encounter (Signed)
Per Dr Leta Baptist, informed patient his MRI brain results were unremarkable. Patient stated he received results on My Chart. He stated he is taking Propranolol 120 mg once daily. Reminded him of Feb FU. He has seen Dr Rexene Alberts, and will have sleep study done. He verbalized understanding, appreciation for call.

## 2015-12-16 MED FILL — PROPRANOLOL ER 120 MG CAP: 120 | 30 days supply | Qty: 30 | Fill #0

## 2015-12-23 DIAGNOSIS — F4322 Adjustment disorder with anxiety: Secondary | ICD-10-CM | POA: Diagnosis not present

## 2015-12-26 ENCOUNTER — Ambulatory Visit (INDEPENDENT_AMBULATORY_CARE_PROVIDER_SITE_OTHER): Payer: 59 | Admitting: Neurology

## 2015-12-26 DIAGNOSIS — G4733 Obstructive sleep apnea (adult) (pediatric): Secondary | ICD-10-CM

## 2015-12-26 DIAGNOSIS — G4720 Circadian rhythm sleep disorder, unspecified type: Secondary | ICD-10-CM

## 2015-12-26 DIAGNOSIS — R9431 Abnormal electrocardiogram [ECG] [EKG]: Secondary | ICD-10-CM

## 2016-01-02 ENCOUNTER — Telehealth: Payer: Self-pay

## 2016-01-02 NOTE — Telephone Encounter (Signed)
-----   Message from Star Age, MD sent at 01/02/2016  8:14 AM EST ----- Patient referred by Dr. Leta Baptist, seen by me on 12/10/15, diagnostic PSG on 12/26/15.    Please call and notify the patient that the recent sleep study did confirm the diagnosis of mild obstructive sleep apnea, but severe in REM/dream sleep and with desaturations into the low 80s and as low as 70% during REM sleep. Given his migraine Hx and AM HAs reported, I recommend treatment in the form of CPAP. This will require a repeat sleep study for proper titration and mask fitting. Please explain to patient and arrange for a CPAP titration study. I have placed an order in the chart. Thanks, and please route to [facility] for scheduling next sleep study.  Star Age, MD, PhD [facility])

## 2016-01-02 NOTE — Addendum Note (Signed)
Addended by: Star Age on: 01/02/2016 08:14 AM   Modules accepted: Orders

## 2016-01-02 NOTE — Progress Notes (Signed)
Patient referred by Dr. Leta Baptist, seen by me on 12/10/15, diagnostic PSG on 12/26/15.    Please call and notify the patient that the recent sleep study did confirm the diagnosis of mild obstructive sleep apnea, but severe in REM/dream sleep and with desaturations into the low 80s and as low as 70% during REM sleep. Given his migraine Hx and AM HAs reported, I recommend treatment in the form of CPAP. This will require a repeat sleep study for proper titration and mask fitting. Please explain to patient and arrange for a CPAP titration study. I have placed an order in the chart. Thanks, and please route to [facility] for scheduling next sleep study.  Star Age, MD, PhD [facility])

## 2016-01-02 NOTE — Telephone Encounter (Signed)
I called pt to discuss sleep study results. Pt reports that he is busy right now and will call me back this afternoon.

## 2016-01-02 NOTE — Procedures (Signed)
PATIENT'S NAME:  Adam Dunn, Adam Dunn DOB:      [birth year]      MR#:    [id number]     DATE OF RECORDING: 12/26/2015 REFERRING M.D.:  Mauricio Po, FNP Study Performed:   Baseline Polysomnogram HISTORY:  35 year old man with a history of migraine headaches, paresthesias, anxiety, reflux disease and morbid obesity, who reports snoring and excessive daytime somnolence as well as recurrent morning headaches. The patient endorsed the Epworth Sleepiness Scale at 8 points. The patient's weight 267 pounds with a height of 66 (inches), resulting in a BMI of 42.9 kg/m2. The patient's neck circumference measured 17 inches.  CURRENT MEDICATIONS: Augmentin, Relpax, Prilosec, Inderal   PROCEDURE:  This is a multichannel digital polysomnogram utilizing the Somnostar 11.2 system.  Electrodes and sensors were applied and monitored per AASM Specifications.   EEG, EOG, Chin and Limb EMG, were sampled at 200 Hz.  ECG, Snore and Nasal Pressure, Thermal Airflow, Respiratory Effort, CPAP Flow and Pressure, Oximetry was sampled at 50 Hz. Digital video and audio were recorded.      BASELINE STUDY  Lights Out was at 22:51 and Lights On at 05:05.  Total recording time (TRT) was 374.5 minutes, with a total sleep time (TST) of  322.5 minutes.   The patient's sleep latency was 14 minutes.  REM latency was 74.5 minutes, which is normal.  The sleep efficiency was 86.1 %.     SLEEP ARCHITECTURE: WASO (Wake after sleep onset) was 38 minutes with mild to moderate sleep fragmentation noted.  There were 23.5 minutes in Stage N1, 125.5 minutes Stage N2, 124 minutes Stage N3 and 49.5 minutes in Stage REM.  The percentage of Stage N1 was 7.3%, Stage N2 was 38.9%, which is reduced, Stage N3 was 38.4%, which is increased, and Stage R (REM sleep) was 15.3%, which is mildly reduced.   The arousals were noted as: 26 were spontaneous, 1 were associated with PLMs, 10 were associated with respiratory events.    Audio and video analysis did not show  any abnormal or unusual movements, behaviors, phonations or vocalizations.  The patient took no bathroom breaks. Mild snoring was noted.  The EKG was in keeping with normal sinus rhythm (NSR) with rare PVCs noted.  RESPIRATORY ANALYSIS:  There were a total of 54 respiratory events:  0 obstructive apneas, 0 central apneas and 0 mixed apneas with a total of 0 apneas and an apnea index (AI) of 0 /hour. There were 54 hypopneas with a hypopnea index of 10. /hour. The patient also had 1 respiratory event related arousals (RERAs).      The total APNEA/HYPOPNEA INDEX (AHI) was 10./hour and the total RESPIRATORY DISTURBANCE INDEX was 10.2 /hour.  31 events occurred in REM sleep and 46 events in NREM. The REM AHI was 37.6 /hour, versus a non-REM AHI of 5.1. The patient spent 246 minutes of total sleep time in the supine position and 77 minutes in non-supine.. The supine AHI was 8.3 versus a non-supine AHI of 15.7.  OXYGEN SATURATION & C02:  The Wake baseline 02 saturation was 96%, with the lowest being 70% in supine REM sleep. Time spent below 89% saturation equaled 30 minutes.  PERIODIC LIMB MOVEMENTS:   The patient had a total of 17 Periodic Limb Movements.  The Periodic Limb Movement (PLM) index was 3.2 and the PLM Arousal index was .2/hour.  Post-study, the patient indicated that sleep was worse than usual.   IMPRESSION: 1. Obstructive Sleep Apnea (OSA) 2. Non-specific abnormal EKG  3. Dysfunctions associated with sleep stages or arousal from sleep  RECOMMENDATIONS: 1. This study demonstrates overall mild obstructive sleep apnea, severe in REM sleep with a total AHI of 10/hour, REM AHI of 37.6/hour, and O2 nadir of 70% during supine REM sleep. Given the patient's medical history and sleep related complaints, a full-night CPAP titration study is recommended to optimize therapy. Other treatment options may include avoidance of supine sleep position along with weight loss, upper airway or jaw surgery in  selected patients or the use of an oral appliance in certain patients. ENT evaluation and/or consultation with a maxillofacial surgeon or dentist may be feasible and some instances.  2. Please note that untreated obstructive sleep apnea carries additional perioperative morbidity. Patients with significant obstructive sleep apnea should receive perioperative PAP therapy and the surgeons and particularly the anesthesiologist should be informed of the diagnosis and the severity of the sleep disordered breathing. 3. This study shows sleep fragmentation and abnormal sleep stage percentages; these are nonspecific findings and per se do not signify an intrinsic sleep disorder or a cause for the patient's sleep-related symptoms. Causes include (but are not limited to) the first night effect of the sleep study, circadian rhythm disturbances, medication effect or an underlying mood disorder or medical problem.  4. The study showed rare PVCs on single lead EKG; clinical correlation is recommended and consultation with cardiology may be feasible.  5. The patient will be seen in follow-up by Dr. Rexene Alberts at [facility] for discussion of the test results and further management strategies. The referring provider will be notified of the test results.  I certify that I have reviewed the entire raw data recording prior to the issuance of this report in accordance with the Standards of Accreditation of the American Academy of Sleep Medicine (AASM).     Star Age, MD, PhD Diplomat, American Board of Psychiatry and Neurology (Neurology and Sleep Medicine)

## 2016-01-02 NOTE — Telephone Encounter (Signed)
I called pt again. I advised him that his sleep study did confirm the diagnosis of mild osa but severe osa in the REM sleep with oxygen desaturations. Given his history of headaches, Dr. Rexene Alberts recommends treatment in the form of cpap which will require another sleep study for proper titration. Pt is agreeable to this and knows that our sleep lab will give him a call to schedule. Pt verbalized understanding of results. Pt had no questions at this time but was encouraged to call back if questions arise.

## 2016-01-07 DIAGNOSIS — F4322 Adjustment disorder with anxiety: Secondary | ICD-10-CM | POA: Diagnosis not present

## 2016-01-10 ENCOUNTER — Encounter: Payer: Self-pay | Admitting: Family

## 2016-01-10 DIAGNOSIS — G43109 Migraine with aura, not intractable, without status migrainosus: Secondary | ICD-10-CM

## 2016-01-10 DIAGNOSIS — F411 Generalized anxiety disorder: Secondary | ICD-10-CM

## 2016-01-10 MED ORDER — PROPRANOLOL HCL ER 120 MG PO CP24: 120.0000 mg | ORAL_CAPSULE | Freq: Every day | ORAL | 3 refills | Status: DC

## 2016-01-10 MED FILL — PROPRANOLOL ER 120 MG CAP: 120 | 30 days supply | Qty: 30 | Fill #0

## 2016-01-21 DIAGNOSIS — F4322 Adjustment disorder with anxiety: Secondary | ICD-10-CM | POA: Diagnosis not present

## 2016-01-30 ENCOUNTER — Encounter: Payer: Self-pay | Admitting: Family

## 2016-02-04 DIAGNOSIS — F4322 Adjustment disorder with anxiety: Secondary | ICD-10-CM | POA: Diagnosis not present

## 2016-02-05 MED ORDER — ALBUTEROL SULFATE HFA 108 (90 BASE) MCG/ACT IN AERS: 2.0000 | INHALATION_SPRAY | Freq: Four times a day (QID) | RESPIRATORY_TRACT | 0 refills | Status: DC | PRN

## 2016-02-05 MED FILL — VENTOLIN HFA 90 MCG INHALER: 108 (90 BAS | 25 days supply | Qty: 18 | Fill #0

## 2016-02-05 NOTE — Addendum Note (Signed)
Addended by: Mauricio Po D on: 02/05/2016 03:25 PM   Modules accepted: Orders

## 2016-02-12 ENCOUNTER — Ambulatory Visit (INDEPENDENT_AMBULATORY_CARE_PROVIDER_SITE_OTHER): Payer: 59 | Admitting: Neurology

## 2016-02-12 DIAGNOSIS — G4733 Obstructive sleep apnea (adult) (pediatric): Secondary | ICD-10-CM | POA: Diagnosis not present

## 2016-02-12 DIAGNOSIS — G4720 Circadian rhythm sleep disorder, unspecified type: Secondary | ICD-10-CM

## 2016-02-14 ENCOUNTER — Telehealth: Payer: Self-pay | Admitting: Family Medicine

## 2016-02-14 MED ORDER — OSELTAMIVIR PHOSPHATE 75 MG PO CAPS: 75.0000 mg | ORAL_CAPSULE | Freq: Every day | ORAL | 0 refills | Status: DC

## 2016-02-14 MED FILL — PROPRANOLOL ER 120 MG CAP: 120 | 30 days supply | Qty: 30 | Fill #1

## 2016-02-14 MED FILL — OMEPRAZOLE DR 20 MG CAPSULE: 20 | 90 days supply | Qty: 90 | Fill #1

## 2016-02-14 NOTE — Telephone Encounter (Signed)
Patient's daughter was diagnosed with the flu today. Patient wants to know if Tamiflu can be called in for her and her husband,Jerrard Bessey.  Patient uses [facility]  I will call in tamiflu.

## 2016-02-14 NOTE — Telephone Encounter (Signed)
Message left for wife that rx was sent in

## 2016-02-14 NOTE — Procedures (Signed)
PATIENT'S NAME:  Adam Dunn, Adam Dunn DOB:      1980/05/10      MR#:    [id number]     DATE OF RECORDING: 02/12/2016 REFERRING M.D.:  Mauricio Po, FNP Study Performed:   CPAP  Titration HISTORY: 36 year old man with a history of migraine headaches, paresthesias, anxiety, reflux disease and morbid obesity, who returns for CPAP titration. He had a diagnostic PSG on 12/26/15, which showed an AHI of 10/hour, REM AHI was 37.6 /hour, supine AHI was 8.3 versus and O2 nadir was 70% in supine REM sleep. The patient endorsed the Epworth Sleepiness Scale at 8 points. The patient's weight 267 pounds with a height of 66 (inches), resulting in a BMI of 42.9 kg/m2. The patient's neck circumference measured 17 inches.  CURRENT MEDICATIONS: Augmentin, Relpax, Prilosec, Inderal   PROCEDURE:  This is a multichannel digital polysomnogram utilizing the SomnoStar 11.2 system.  Electrodes and sensors were applied and monitored per AASM Specifications.   EEG, EOG, Chin and Limb EMG, were sampled at 200 Hz.  ECG, Snore and Nasal Pressure, Thermal Airflow, Respiratory Effort, CPAP Flow and Pressure, Oximetry was sampled at 50 Hz. Digital video and audio were recorded.      The patient was fitted with a medium P10 nasal pillows interface. CPAP was initiated at 5 cmH20 with heated humidity per AASM standards and pressure was advanced to 6cmH20 because of hypopneas, apneas and desaturations and snoring.  At a PAP pressure of 6 cmH20, there was a reduction of the AHI to 0 with brief supine REM sleep achieved and O2 nadir of 89%.   Lights Out was at 22:49 and Lights On at 05:21. Total recording time (TRT) was 392 minutes, with a total sleep time (TST) of 306 minutes. The patient's sleep latency was 99 minutes, REM latency was 119.5 minutes.  The sleep efficiency was 78.1 %.    SLEEP ARCHITECTURE: WASO (Wake after sleep onset)  was 18 minutes with mild sleep fragmentation noted.  There were 42 minutes in Stage N1, 120.5 minutes Stage N2,  112.5 minutes Stage N3 and 31 minutes in Stage REM.  The percentage of Stage N1 was 13.7%, which is increased, Stage N2 was 39.4%, which is reduced, Stage N3 was 36.8%, which is increased, and Stage R (REM sleep) was 10.1%, which is reduced.   The arousals were noted as: 47 were spontaneous, 0 were associated with PLMs, 0 were associated with respiratory events. Audio and video analysis did not show any abnormal or unusual movements, behaviors, phonations or vocalizations.  The patient took no bathroom breaks.  EKG was in keeping with normal sinus rhythm (NSR) with prominent EKG artifact noted in the EEG.  RESPIRATORY ANALYSIS: There was a total of 0 respiratory events: 0 obstructive apneas, 0 central apneas and 0 mixed apneas with a total of 0 apneas and an apnea index (AI) of 0 /hour. There were 0 hypopneas with a hypopnea index of 0/hour. The patient also had 0 respiratory event related arousals (RERAs).      The total APNEA/HYPOPNEA INDEX  (AHI) was 0 /hour and the total RESPIRATORY DISTURBANCE INDEX was 0 .hour  0 events occurred in REM sleep and 0 events in NREM. The REM AHI was 0 /hour versus a non-REM AHI of 0 /hour.  The patient spent 220 minutes of total sleep time in the supine position and 86 minutes in non-supine. The supine AHI was 0.0, versus a non-supine AHI of 0.0.  OXYGEN SATURATION & C02:  The baseline  02 saturation was 95%, with the lowest being 87%. Time spent below 89% saturation equaled 1 minutes.  PERIODIC LIMB MOVEMENTS:  The patient had a total of 0 Periodic Limb Movements. The Periodic Limb Movement (PLM) index was 0 and the PLM Arousal index was 0/hour.   Post-study, the patient indicated that sleep was worse than usual.   DIAGNOSIS  1. Obstructive Sleep Apnea  2. Dysfunctions associated with sleep stages or arousal from sleep  PLANS/RECOMMENDATIONS:  1. This study demonstrates resolution of the patient's obstructive sleep apnea with CPAP therapy. I will, therefore,  start the patient on home CPAP treatment at a pressure of 11 cm via medium nasal pillows with heated humidity. The patient should be reminded to be fully compliant with PAP therapy to improve sleep related symptoms and decrease long term cardiovascular risks. The patient should be reminded, that it may take up to 3 months to get fully used to using PAP with all planned sleep. The earlier full compliance is achieved, the better long term compliance tends to be. Please note that untreated obstructive sleep apnea carries additional perioperative morbidity. Patients with significant obstructive sleep apnea should receive perioperative PAP therapy and the surgeons and particularly the anesthesiologist should be informed of the diagnosis and the severity of the sleep disordered breathing. 2. This study shows sleep fragmentation and abnormal sleep stage percentages; these are nonspecific findings and per se do not signify an intrinsic sleep disorder or a cause for the patient's sleep-related symptoms. Causes include (but are not limited to) the first night effect of the sleep study, circadian rhythm disturbances, medication effect or an underlying mood disorder or medical problem.  3. The patient should be cautioned not to drive, work at heights, or operate dangerous or heavy equipment when tired or sleepy. Review and reiteration of good sleep hygiene measures should be pursued with any patient. 4. The patient will be seen in follow-up by Dr. Rexene Alberts at [facility] for discussion of the test results and further management strategies. The referring provider will be notified of the test results.  I certify that I have reviewed the entire raw data recording prior to the issuance of this report in accordance with the Standards of Accreditation of the American Academy of Sleep Medicine (AASM)    Star Age, MD, PhD Diplomat, American Board of Psychiatry and Neurology (Neurology and Sleep Medicine)

## 2016-02-14 NOTE — Progress Notes (Signed)
Patient referred by Dr. Leta Baptist, seen by me on 12/10/15, diagnostic PSG on 12/26/15, cpap study on 02/12/16:  Please call and inform patient that I have entered an order for treatment with positive airway pressure (PAP) treatment of obstructive sleep apnea (OSA). He did well during the latest sleep study with CPAP. We will, therefore, arrange for a machine for home use through a DME (durable medical equipment) company of His choice; and I will see the patient back in follow-up in about 8-10 weeks. Please also explain to the patient that I will be looking out for compliance data, which can be downloaded from the machine (stored on an SD card, that is inserted in the machine) or via remote access through a modem, that is built into the machine. At the time of the followup appointment we will discuss sleep study results and how it is going with PAP treatment at home. Please advise patient to bring His machine at the time of the first FU visit, even though this is cumbersome. Bringing the machine for every visit after that will likely not be needed, but often helps for the first visit to troubleshoot if needed. Please re-enforce the importance of compliance with treatment and the need for [place] to monitor compliance data - often an insurance requirement and actually good feedback for the patient as far as how they are doing.  Also remind patient, that any interim PAP machine or mask issues should be first addressed with the DME company, as they can often help better with technical and mask fit issues. Please ask if patient has a preference regarding DME company.  Please also make sure, the patient has a follow-up appointment with me in about 8-10 weeks from the setup date, thanks.  Once you have spoken to the patient - and faxed/routed report to PCP and referring MD (if other than PCP), you can close this encounter, thanks,   Star Age, MD, PhD [facility] ([place])

## 2016-02-14 NOTE — Addendum Note (Signed)
Addended by: Star Age on: 02/14/2016 11:40 AM   Modules accepted: Orders

## 2016-02-17 ENCOUNTER — Telehealth: Payer: Self-pay

## 2016-02-17 NOTE — Telephone Encounter (Signed)
I called pt and advised him of his sleep study results. Pt is agreeable to a cpap at home. I will send the order to [facility] and I advised the pt that they will call him within about one week to get it set up. I reviewed cpap compliance expectations with the pt. Pt is agreeable to a follow up appt on 05/12/16 at 2:00pm with Dr. Rexene Alberts. Pt asked me to cancel his appt with Dr. Leta Baptist on 02/19/2016 because he says that his "issues are being addressed by Dr. Rexene Alberts." I confirmed that the address that we have on file is correct, and I will mail the pt a cpap start letter. Pt verbalized understanding of results. Pt had no questions at this time but was encouraged to call back if questions arise.

## 2016-02-17 NOTE — Telephone Encounter (Signed)
-----   Message from Star Age, MD sent at 02/14/2016 11:40 AM EST ----- Patient referred by Dr. Leta Baptist, seen by me on 12/10/15, diagnostic PSG on 12/26/15, cpap study on 02/12/16:  Please call and inform patient that I have entered an order for treatment with positive airway pressure (PAP) treatment of obstructive sleep apnea (OSA). He did well during the latest sleep study with CPAP. We will, therefore, arrange for a machine for home use through a DME (durable medical equipment) company of His choice; and I will see the patient back in follow-up in about 8-10 weeks. Please also explain to the patient that I will be looking out for compliance data, which can be downloaded from the machine (stored on an SD card, that is inserted in the machine) or via remote access through a modem, that is built into the machine. At the time of the followup appointment we will discuss sleep study results and how it is going with PAP treatment at home. Please advise patient to bring His machine at the time of the first FU visit, even though this is cumbersome. Bringing the machine for every visit after that will likely not be needed, but often helps for the first visit to troubleshoot if needed. Please re-enforce the importance of compliance with treatment and the need for [place] to monitor compliance data - often an insurance requirement and actually good feedback for the patient as far as how they are doing.  Also remind patient, that any interim PAP machine or mask issues should be first addressed with the DME company, as they can often help better with technical and mask fit issues. Please ask if patient has a preference regarding DME company.  Please also make sure, the patient has a follow-up appointment with me in about 8-10 weeks from the setup date, thanks.  Once you have spoken to the patient - and faxed/routed report to PCP and referring MD (if other than PCP), you can close this encounter, thanks,   Star Age, MD,  PhD [facility] ([place])

## 2016-02-18 DIAGNOSIS — F4322 Adjustment disorder with anxiety: Secondary | ICD-10-CM | POA: Diagnosis not present

## 2016-02-19 ENCOUNTER — Ambulatory Visit: Payer: 59 | Admitting: Diagnostic Neuroimaging

## 2016-03-03 DIAGNOSIS — F4322 Adjustment disorder with anxiety: Secondary | ICD-10-CM | POA: Diagnosis not present

## 2016-03-03 DIAGNOSIS — G4733 Obstructive sleep apnea (adult) (pediatric): Secondary | ICD-10-CM | POA: Diagnosis not present

## 2016-03-17 DIAGNOSIS — F4322 Adjustment disorder with anxiety: Secondary | ICD-10-CM | POA: Diagnosis not present

## 2016-03-18 ENCOUNTER — Encounter: Payer: Self-pay | Admitting: Internal Medicine

## 2016-03-18 ENCOUNTER — Ambulatory Visit (INDEPENDENT_AMBULATORY_CARE_PROVIDER_SITE_OTHER): Payer: 59 | Admitting: Internal Medicine

## 2016-03-18 ENCOUNTER — Ambulatory Visit (INDEPENDENT_AMBULATORY_CARE_PROVIDER_SITE_OTHER)
Admission: RE | Admit: 2016-03-18 | Discharge: 2016-03-18 | Disposition: A | Discharge status: Home / Self Care | Payer: 59 | Source: Ambulatory Visit | Attending: Internal Medicine | Admitting: Internal Medicine

## 2016-03-18 VITALS — BP 122/80 | HR 66 | Temp 98.7°F | Resp 16 | Wt 246.0 lb

## 2016-03-18 DIAGNOSIS — J069 Acute upper respiratory infection, unspecified: Secondary | ICD-10-CM | POA: Insufficient documentation

## 2016-03-18 DIAGNOSIS — B9789 Other viral agents as the cause of diseases classified elsewhere: Secondary | ICD-10-CM

## 2016-03-18 DIAGNOSIS — R059 Cough, unspecified: Secondary | ICD-10-CM

## 2016-03-18 DIAGNOSIS — R05 Cough: Secondary | ICD-10-CM

## 2016-03-18 DIAGNOSIS — B309 Viral conjunctivitis, unspecified: Secondary | ICD-10-CM | POA: Insufficient documentation

## 2016-03-18 MED ORDER — TOBRAMYCIN-DEXAMETHASONE 0.3-0.1 % OP SUSP: 2.0000 [drp] | Freq: Four times a day (QID) | OPHTHALMIC | 0 refills | Status: DC

## 2016-03-18 MED ORDER — HYDROCODONE-HOMATROPINE 5-1.5 MG/5ML PO SYRP: 5.0000 mL | ORAL_SOLUTION | Freq: Three times a day (TID) | ORAL | 0 refills | Status: DC | PRN

## 2016-03-18 NOTE — Progress Notes (Signed)
Subjective:  Patient ID: Adam Dunn, male    DOB: 1980-12-29  Age: 36 y.o. MRN: [id number]  CC: Cough; Nasal Congestion; and Conjunctivitis   HPI Adam Dunn presents for A 4 day history of sore throat, nonproductive cough, red itchy eyes with crusting and matting, and chills. He denies fever, shortness of breath, lymphadenopathy, or hemoptysis.  Outpatient Medications Prior to Visit  Medication Sig Dispense Refill  . albuterol (PROVENTIL HFA;VENTOLIN HFA) 108 (90 Base) MCG/ACT inhaler Inhale 2 puffs into the lungs every 6 (six) hours as needed for wheezing or shortness of breath. 1 Inhaler 0  . eletriptan (RELPAX) 40 MG tablet Take 1 tablet (40 mg total) by mouth as needed for migraine or headache. May repeat in 2 hours if headache persists or recurs. 9 tablet 11  . omeprazole (PRILOSEC OTC) 20 MG tablet Take 1 tablet (20 mg total) by mouth daily as needed. 90 tablet 1  . propranolol ER (INDERAL LA) 120 MG 24 hr capsule Take 1 capsule (120 mg total) by mouth daily. 30 capsule 3  . amoxicillin-clavulanate (AUGMENTIN) 875-125 MG tablet Take 1 tablet by mouth 2 (two) times daily. 20 tablet 0  . oseltamivir (TAMIFLU) 75 MG capsule Take 1 capsule (75 mg total) by mouth daily. 10 capsule 0   No facility-administered medications prior to visit.     ROS Review of Systems  Constitutional: Positive for chills. Negative for activity change, appetite change, fatigue, fever and unexpected weight change.  HENT: Positive for congestion and sore throat. Negative for rhinorrhea, sinus pressure, trouble swallowing and voice change.   Eyes: Positive for discharge, redness and itching. Negative for photophobia, pain and visual disturbance.  Respiratory: Positive for cough. Negative for chest tightness, shortness of breath, wheezing and stridor.   Cardiovascular: Negative for chest pain, palpitations and leg swelling.  Gastrointestinal: Negative for abdominal pain, constipation, diarrhea, nausea and  vomiting.  Endocrine: Negative.   Genitourinary: Negative.   Musculoskeletal: Negative.  Negative for back pain, myalgias and neck pain.  Skin: Negative.   Neurological: Negative.   Hematological: Negative.  Negative for adenopathy. Does not bruise/bleed easily.  Psychiatric/Behavioral: Negative.     Objective:  BP 122/80 (BP Location: Left Arm, Patient Position: Sitting)   Pulse 66   Temp 98.7 F (37.1 C) (Oral)   Resp 16   Wt 246 lb (111.6 kg)   SpO2 97%   BMI 39.71 kg/m   BP Readings from Last 3 Encounters:  03/18/16 122/80  12/10/15 128/82  11/14/15 118/74    Wt Readings from Last 3 Encounters:  03/18/16 246 lb (111.6 kg)  12/10/15 266 lb (120.7 kg)  11/14/15 261 lb (118.4 kg)    Physical Exam  Constitutional: He is oriented to person, place, and time.  Non-toxic appearance. He does not have a sickly appearance. He does not appear ill. No distress.  HENT:  Mouth/Throat: Mucous membranes are normal. Mucous membranes are not pale, not dry and not cyanotic. Posterior oropharyngeal erythema present. No oropharyngeal exudate, posterior oropharyngeal edema or tonsillar abscesses.  Eyes: EOM are normal. Right eye exhibits no chemosis, no discharge, no exudate and no hordeolum. No foreign body present in the right eye. Left eye exhibits no chemosis, no discharge, no exudate and no hordeolum. No foreign body present in the left eye. Right conjunctiva is injected. Right conjunctiva has no hemorrhage. Left conjunctiva is injected. Left conjunctiva has no hemorrhage. No scleral icterus.    Neck: Normal range of motion. Neck supple. No  JVD present. No tracheal deviation present. No thyromegaly present.  Cardiovascular: Normal rate, regular rhythm and intact distal pulses.  Exam reveals no gallop.   No murmur heard. Pulmonary/Chest: Effort normal and breath sounds normal. No stridor. No respiratory distress. He has no wheezes. He has no rales. He exhibits no tenderness.  Abdominal:  Soft. Bowel sounds are normal. He exhibits no distension and no mass. There is no tenderness. There is no rebound and no guarding.  Musculoskeletal: Normal range of motion. He exhibits no edema, tenderness or deformity.  Lymphadenopathy:    He has no cervical adenopathy.  Neurological: He is oriented to person, place, and time.  Skin: Skin is warm and dry. No rash noted. He is not diaphoretic. No erythema. No pallor.  Vitals reviewed.   Lab Results  Component Value Date   GLUCOSE 97 09/13/2007   CHOL 162 09/13/2007   TRIG 49 09/13/2007   HDL 39.4 09/13/2007   LDLCALC 113 (H) 09/13/2007   TSH 2.750 11/13/2015   HGBA1C 5.3 11/13/2015    Dg Chest 2 View  Result Date: 03/18/2016 CLINICAL DATA:  Cough for 4 days EXAM: CHEST  2 VIEW COMPARISON:  Chest x-ray of 10/17/2014 FINDINGS: No pneumonia or effusion is seen. Mediastinal and hilar contours are unremarkable. There is mild peribronchial thickening which may indicate bronchitis. Mediastinal and hilar contours are unremarkable. The heart is within normal limits in size. No bony abnormality is seen. IMPRESSION: No pneumonia or effusion.  Question bronchitis. Electronically Signed   By: Ivar Drape M.D.   On: 03/18/2016 16:38    Assessment & Plan:   Harlis was seen today for cough, nasal congestion and conjunctivitis.  Diagnoses and all orders for this visit:  Acute viral conjunctivitis of both eyes -     Discontinue: tobramycin-dexamethasone (TOBRADEX) ophthalmic solution; Place 2 drops into both eyes every 6 (six) hours. -     tobramycin-dexamethasone (TOBRADEX) ophthalmic solution; Place 2 drops into both eyes every 6 (six) hours.  Cough- his chest x-ray is negative for infiltrate will treat for viral URI -     DG Chest 2 View; Future  Viral URI with cough -     HYDROcodone-homatropine (HYCODAN) 5-1.5 MG/5ML syrup; Take 5 mLs by mouth every 8 (eight) hours as needed for cough.   I have discontinued Adam Dunn  amoxicillin-clavulanate and oseltamivir. I am also having him start on HYDROcodone-homatropine. Additionally, I am having him maintain his eletriptan, omeprazole, propranolol ER, albuterol, and tobramycin-dexamethasone.  Meds ordered this encounter  Medications  . DISCONTD: tobramycin-dexamethasone (TOBRADEX) ophthalmic solution    Sig: Place 2 drops into both eyes every 6 (six) hours.    Dispense:  5 mL    Refill:  0  . HYDROcodone-homatropine (HYCODAN) 5-1.5 MG/5ML syrup    Sig: Take 5 mLs by mouth every 8 (eight) hours as needed for cough.    Dispense:  120 mL    Refill:  0  . tobramycin-dexamethasone (TOBRADEX) ophthalmic solution    Sig: Place 2 drops into both eyes every 6 (six) hours.    Dispense:  5 mL    Refill:  0     Follow-up: Return if symptoms worsen or fail to improve.  Scarlette Calico, MD

## 2016-03-18 NOTE — Patient Instructions (Signed)
Cough, Adult Coughing is a reflex that clears your throat and your airways. Coughing helps to heal and protect your lungs. It is normal to cough occasionally, but a cough that happens with other symptoms or lasts a long time may be a sign of a condition that needs treatment. A cough may last only 2-3 weeks (acute), or it may last longer than 8 weeks (chronic). What are the causes? Coughing is commonly caused by:  Breathing in substances that irritate your lungs.  A viral or bacterial respiratory infection.  Allergies.  Asthma.  Postnasal drip.  Smoking.  Acid backing up from the stomach into the esophagus (gastroesophageal reflux).  Certain medicines.  Chronic lung problems, including COPD (or rarely, lung cancer).  Other medical conditions such as heart failure.  Follow these instructions at home: Pay attention to any changes in your symptoms. Take these actions to help with your discomfort:  Take medicines only as told by your health care provider. ? If you were prescribed an antibiotic medicine, take it as told by your health care provider. Do not stop taking the antibiotic even if you start to feel better. ? Talk with your health care provider before you take a cough suppressant medicine.  Drink enough fluid to keep your urine clear or pale yellow.  If the air is dry, use a cold steam vaporizer or humidifier in your bedroom or your home to help loosen secretions.  Avoid anything that causes you to cough at work or at home.  If your cough is worse at night, try sleeping in a semi-upright position.  Avoid cigarette smoke. If you smoke, quit smoking. If you need help quitting, ask your health care provider.  Avoid caffeine.  Avoid alcohol.  Rest as needed.  Contact a health care provider if:  You have new symptoms.  You cough up pus.  Your cough does not get better after 2-3 weeks, or your cough gets worse.  You cannot control your cough with suppressant  medicines and you are losing sleep.  You develop pain that is getting worse or pain that is not controlled with pain medicines.  You have a fever.  You have unexplained weight loss.  You have night sweats. Get help right away if:  You cough up blood.  You have difficulty breathing.  Your heartbeat is very fast. This information is not intended to replace advice given to you by your health care provider. Make sure you discuss any questions you have with your health care provider. Document Released: 06/20/2010 Document Revised: 05/30/2015 Document Reviewed: 02/28/2014 Elsevier Interactive Patient Education  2017 [employer]  

## 2016-03-18 NOTE — Progress Notes (Signed)
Pre visit review using our clinic review tool, if applicable. No additional management support is needed unless otherwise documented below in the visit note. 

## 2016-03-19 MED FILL — PROPRANOLOL ER 120 MG CAP: 120 | 30 days supply | Qty: 30 | Fill #2

## 2016-03-27 ENCOUNTER — Encounter: Payer: Self-pay | Admitting: Internal Medicine

## 2016-03-29 ENCOUNTER — Other Ambulatory Visit: Payer: Self-pay | Admitting: Internal Medicine

## 2016-03-29 DIAGNOSIS — B9789 Other viral agents as the cause of diseases classified elsewhere: Principal | ICD-10-CM

## 2016-03-29 DIAGNOSIS — J069 Acute upper respiratory infection, unspecified: Secondary | ICD-10-CM

## 2016-03-29 MED ORDER — HYDROCODONE-HOMATROPINE 5-1.5 MG/5ML PO SYRP: 5.0000 mL | ORAL_SOLUTION | Freq: Three times a day (TID) | ORAL | 0 refills | Status: DC | PRN

## 2016-03-30 NOTE — Telephone Encounter (Signed)
Place script up front for pick-up...Adam Dunn

## 2016-03-31 DIAGNOSIS — G4733 Obstructive sleep apnea (adult) (pediatric): Secondary | ICD-10-CM | POA: Diagnosis not present

## 2016-04-07 DIAGNOSIS — F4322 Adjustment disorder with anxiety: Secondary | ICD-10-CM | POA: Diagnosis not present

## 2016-04-20 ENCOUNTER — Telehealth: Payer: Self-pay

## 2016-04-20 ENCOUNTER — Other Ambulatory Visit: Payer: Self-pay

## 2016-04-20 MED ORDER — RIZATRIPTAN BENZOATE 10 MG PO TABS: ORAL_TABLET | ORAL | 0 refills | Status: DC

## 2016-04-20 MED ORDER — RIZATRIPTAN BENZOATE 5 MG PO TABS: 5.0000 mg | ORAL_TABLET | ORAL | 0 refills | Status: DC | PRN

## 2016-04-20 MED FILL — RIZATRIPTAN 5 MG TABLET: 5 | 20 days supply | Qty: 10 | Fill #0

## 2016-04-20 NOTE — Telephone Encounter (Signed)
Pts insurance does not cover relpax. Alternatives would be imitrex or maxalt.

## 2016-04-20 NOTE — Telephone Encounter (Signed)
Relpax discontinued. Maxalt sent to pharmacy.

## 2016-04-20 NOTE — Telephone Encounter (Signed)
LVM letting pt know.  

## 2016-05-01 DIAGNOSIS — G4733 Obstructive sleep apnea (adult) (pediatric): Secondary | ICD-10-CM | POA: Diagnosis not present

## 2016-05-05 DIAGNOSIS — F4322 Adjustment disorder with anxiety: Secondary | ICD-10-CM | POA: Diagnosis not present

## 2016-05-06 ENCOUNTER — Encounter: Payer: Self-pay | Admitting: Family

## 2016-05-06 MED ORDER — ONDANSETRON 4 MG PO TBDP: 4.0000 mg | ORAL_TABLET | Freq: Three times a day (TID) | ORAL | 0 refills | Status: DC | PRN

## 2016-05-06 MED FILL — ONDANSETRON ODT 4 MG TABLET: 4 | 10 days supply | Qty: 30 | Fill #0

## 2016-05-12 ENCOUNTER — Encounter: Payer: Self-pay | Admitting: Neurology

## 2016-05-12 ENCOUNTER — Ambulatory Visit (INDEPENDENT_AMBULATORY_CARE_PROVIDER_SITE_OTHER): Payer: 59 | Admitting: Neurology

## 2016-05-12 VITALS — BP 138/78 | HR 82 | Resp 16 | Ht 66.0 in | Wt 254.0 lb

## 2016-05-12 DIAGNOSIS — R208 Other disturbances of skin sensation: Secondary | ICD-10-CM | POA: Diagnosis not present

## 2016-05-12 DIAGNOSIS — G4733 Obstructive sleep apnea (adult) (pediatric): Secondary | ICD-10-CM | POA: Diagnosis not present

## 2016-05-12 DIAGNOSIS — Z9989 Dependence on other enabling machines and devices: Secondary | ICD-10-CM

## 2016-05-12 NOTE — Progress Notes (Signed)
Subjective:    Patient ID: Adam Dunn is a 36 y.o. male.  HPI     Interim history:   Mr. Adam Dunn is a 36 year old right-handed gentleman with an underlying medical history of migraine headaches, paresthesias, anxiety, reflux disease and morbid obesity, who  Presents for follow-up consultation of his obstructive sleep apnea, after sleep study testing and establishing CPAP therapy. The patient is unaccompanied today. I first met him on 12/10/2015 at the request of Dr. Leta Baptist, at which time the patient reported snoring and excessive daytime somnolence as well as recurrent morning headaches. I invited him for sleep study testing. He had a baseline sleep study, followed by a CPAP titration study. I went over his test results with him in detail today. He had a baseline sleep study on 01/02/2016 which showed a sleep efficiency of 86.1%, sleep latency was 14 minutes, REM latency was 74.5 minutes, he had a increased percentage of stage III sleep, REM sleep was 15.3%, mild snoring was noted. EKG showed rare PVCs. Total AHI was 10 per hour, REM AHI was 37.6 per hour, supine AHI was 8.3 per hour. Average oxygen saturation was 96%, nadir was 70% and supine REM sleep. He had no significant PLMS. Based on his medical history and sleep related complaints as well as well as significant desaturations during REM sleep he was invited for a full night CPAP titration study. He had this on 02/12/2016. Sleep efficiency was 78.1%, sleep latency was prolonged at 99 minutes, REM latency borderline at 119.5 minutes. He had an increased percentage of stage III sleep, REM sleep was reduced at 10.1%. CPAP was titrated from 5 cm to 6 cm. AHI was 0 per hour on the final pressure with supine REM sleep achieved and O2 nadir of 89%. Based on his test results I prescribed CPAP therapy for home use.  Today, 05/12/2016 (all dictated new, as well as above notes, some dictation done in note pad or Word, outside of chart, may appear as  copied):   I reviewed his CPAP compliance data from 04/11/2016 through 05/10/2016, which is a total of 30 days, during which time he used CPAP every night with percent used days greater than 4 hours at 100%, indicating superb compliance with an average usage of 7 hours and 42 minutes, residual AHI 0.9 per hour, leak low with the 95th percentile at 1.1 L/m and a pressure of 6 cm with EPR. He reports doing well with CPAP, feels improved in his sleep, in that his AM HAs are much better, daytime energy better. In fact, his left arm paresthesias a better. Sometimes he has tingling and burning in the ulnar aspect of his left arm going into the fourth and fifth digit, starting at the elbow area, but when severe, it seems to affect the entire arm including his left face. He had a brain MRI without contrast which was negative. They could not give him contrast because they could not get access at the time. Overall, he feels improved and less concerned about his paresthesias, migraines are much better, he did not have any recent severe migraines and is quite pleased in that regard as well. He is no longer taking Inderal. Originally, he was given Inderal for anxiety but also for his history of migraines. He reports that his wife's also pleased as he no longer snores.   The patient's allergies, current medications, family history, past medical history, past social history, past surgical history and problem list were reviewed and updated as appropriate.  Previously (copied from previous notes for reference):   12/10/2015: He reports snoring and excessive daytime somnolence as well as recurrent morning headaches. His Epworth sleepiness score is 8 out of 24 today, his fatigue score is 26 out of 63. I reviewed your office note from 11/13/2015. The patient is married and lives with his wife and 3 children. He works full-time. He is a nonsmoker. He drinks alcohol occasionally. He drinks caffeine daily in the form of coffee  or soda, up to 3 per day. He states he had a sleep study some 6 years ago which per his report was borderline. I reviewed his home sleep test report from 11/06/2009, which showed an AHI of 5 per hour, O2 nadir was 67%, mean in an effort, time below 90% saturation was 35 minutes for the night. Total testing time was 13 hours and 5 minutes which is unusual. He has gained about 15 pounds since his home sleep test some 6 years ago and weight has been fluctuating quite a bit as he can lose weight when he really tries hard and has lost down to the mid 170s one time in the past 5 years. He has 3 young children, ages 59, 59 and 33 yo. Typical bedtime is around midnight, wakeup time around 7- 7:15 AM. He has occasional morning headaches. His migraines often happen in the first part of the day and start at the base of his head or posterior neck area. He had a recent brain MRI which showed benign findings. He has had intermittent paresthesias in the upper extremities, left more than right. He does have intermittent restless leg symptoms, is not sure if he twitches or kicks his legs in his sleep. He does report sleep disruption, he denies nocturia.  His Past Medical History Is Significant For: Past Medical History:  Diagnosis Date  . Migraines     His Past Surgical History Is Significant For: Past Surgical History:  Procedure Laterality Date  . HERNIA REPAIR     infant    His Family History Is Significant For: Family History  Problem Relation Age of Onset  . Hypertension Mother   . Migraines Mother   . Fibromyalgia Mother   . Arthritis Mother   . Healthy Father   . Healthy Brother   . Coronary artery disease Maternal Grandfather   . Emphysema Maternal Grandfather   . Colon cancer Neg Hx   . Prostate cancer Neg Hx     His Social History Is Significant For: Social History   Social History  . Marital status: Married    Spouse name: Janett Billow  . Number of children: 3  . Years of education: 79    Occupational History  . Realtor     self employed   Social History Main Topics  . Smoking status: Never Smoker  . Smokeless tobacco: Never Used     Comment: 11/13/15 very very occas cigar  . Alcohol use Yes     Comment: 11/13/15 1 a week  . Drug use: No  . Sexual activity: Not Asked   Other Topics Concern  . None   Social History Narrative   Lives at home with wife, children   Caffeine- 2-3 daily-coffee, soda    His Allergies Are:  Allergies  Allergen Reactions  . Sulfamethoxazole-Trimethoprim     REACTION: Pt does not remember, was an infant  :   His Current Medications Are:  Outpatient Encounter Prescriptions as of 05/12/2016  Medication Sig  . albuterol (PROVENTIL  HFA;VENTOLIN HFA) 108 (90 Base) MCG/ACT inhaler Inhale 2 puffs into the lungs every 6 (six) hours as needed for wheezing or shortness of breath.  Marland Kitchen HYDROcodone-homatropine (HYCODAN) 5-1.5 MG/5ML syrup Take 5 mLs by mouth every 8 (eight) hours as needed for cough.  Marland Kitchen omeprazole (PRILOSEC OTC) 20 MG tablet Take 1 tablet (20 mg total) by mouth daily as needed.  . ondansetron (ZOFRAN-ODT) 4 MG disintegrating tablet Take 1 tablet (4 mg total) by mouth every 8 (eight) hours as needed for nausea or vomiting.  . rizatriptan (MAXALT) 5 MG tablet Take 1 tablet (5 mg total) by mouth as needed for migraine. May repeat in 2 hours if needed  . propranolol ER (INDERAL LA) 120 MG 24 hr capsule Take 1 capsule (120 mg total) by mouth daily. (Patient not taking: Reported on 05/12/2016)   No facility-administered encounter medications on file as of 05/12/2016.   :  Review of Systems:  Out of a complete 14 point review of systems, all are reviewed and negative with the exception of these symptoms as listed below:  Review of Systems  Neurological:       Patient states that he is doing well with CPAP.   Reports that he still has a "burning sensation" on his L arm.     Objective:  Neurologic Exam  Physical Exam Physical  Examination:   Vitals:   05/12/16 1351  BP: 138/78  Pulse: 82  Resp: 16   General Examination: The patient is a very pleasant 36 y.o. male in no acute distress. He appears well-developed and well-nourished and well groomed.   HEENT: Normocephalic, atraumatic, pupils are equal, round and reactive to light and accommodation. Extraocular tracking is good without limitation to gaze excursion or nystagmus noted. Normal smooth pursuit is noted. Hearing is grossly intact. Face is symmetric with normal facial animation and normal facial sensation. Speech is clear with no dysarthria noted. There is no hypophonia. There is no lip, neck/head, jaw or voice tremor. Neck is supple with full range of passive and active motion. There are no carotid bruits on auscultation. Oropharynx exam reveals: mild mouth dryness, good dental hygiene and moderate airway crowding, due to smaller airway entry and larger uvula, tonsils of 1+. Mallampati is class II. Tongue protrudes centrally and palate elevates symmetrically.   Chest: Clear to auscultation without wheezing, rhonchi or crackles noted.  Heart: S1+S2+0, regular and normal without murmurs, rubs or gallops noted.   Abdomen: Soft, non-tender and non-distended with normal bowel sounds appreciated on auscultation.  Extremities: There is no pitting edema in the distal lower extremities bilaterally. Pedal pulses are intact.  Skin: Warm and dry without trophic changes noted. There are no varicose veins.  Musculoskeletal: exam reveals no obvious joint deformities, tenderness or joint swelling or erythema.   Neurologically:  Mental status: The patient is awake, alert and oriented in all 4 spheres. His immediate and remote memory, attention, language skills and fund of knowledge are appropriate. There is no evidence of aphasia, agnosia, apraxia or anomia. Speech is clear with normal prosody and enunciation. Thought process is linear. Mood is normal and affect is  normal.  Cranial nerves II - XII are as described above under HEENT exam. In addition: shoulder shrug is normal with equal shoulder height noted. Motor exam: Normal bulk, strength and tone is noted. There is no resting. Romberg is negative. Reflexes are 1+ to 2+ throughout. Fine motor skills and coordination: intact grossly.   Cerebellar testing: No dysmetria or intention tremor.  There is no truncal or gait ataxia.  Sensory exam: intact to light touch in the upper and lower extremities.  Gait, station and balance: He stands easily. No veering to one side is noted. No leaning to one side is noted. Posture is age-appropriate and stance is narrow based. Gait shows normal stride length and normal pace. No problems turning are noted. Tandem walk is unremarkable.  Assessment and Plan:  In summary, KAHLEEL FADELEY is a very pleasant 36 year old LH male with an underlying medical history of migraine headaches, paresthesias, anxiety, reflux disease and morbid obesity, who Presents for follow-up consultation of his obstructive sleep apnea, which ranges from mild to severe during REM sleep. We talked about his baseline sleep study from December 2017 as well as his CPAP titration study from February 2018 in detail today and compared findings. We also reviewed his compliance data. He is fully compliant with treatment and is commended for this. He endorses improved sleep quality, improved daytime energy level, reduction in migraine headaches and slight improvement in paresthesias. His physical exam is stable. He is reminded to try to pursue weight loss. Furthermore, he is encouraged to continue to be fully compliant with treatment and he is quite motivated to do so as he has benefited greatly from it. From my end of things I suggested a one-year checkup, sooner if needed. If he has recurrence or worsening of his left ear paresthesias, he is encouraged to make a follow-up appointment with Dr. Leta Baptist, as he does not have  an appointment scheduled at this time. I answered all his questions today and the patient was in agreement with the plan. Of note, he is reminded to get in touch with his DME company regarding getting replacement filters which he has not yet changed and his machine. He is using nasal pillows which he has exchanged for new ones. Of note, he does not use humidifier at this time as he did not like the warm moist feeling. On the pressure of 6 cm he can certainly get away without using the humidifier explained.  I spent 25 minutes in total face-to-face time with the patient, more than 50% of which was spent in counseling and coordination of care, reviewing test results, reviewing medication and discussing or reviewing the diagnosis of OSA, its prognosis and treatment options. Pertinent laboratory and imaging test results that were available during this visit with the patient were reviewed by me and considered in my medical decision making (see chart for details).

## 2016-05-12 NOTE — Patient Instructions (Signed)
Please continue using your CPAP regularly. While your insurance requires that you use CPAP at least 4 hours each night on 70% of the nights, I recommend, that you not skip any nights and use it throughout the night if you can. Getting used to CPAP and staying with the treatment long term does take time and patience and discipline. Untreated obstructive sleep apnea when it is moderate to severe can have an adverse impact on cardiovascular health and raise her risk for heart disease, arrhythmias, hypertension, congestive heart failure, stroke and diabetes. Untreated obstructive sleep apnea causes sleep disruption, nonrestorative sleep, and sleep deprivation. This can have an impact on your day to day functioning and cause daytime sleepiness and impairment of cognitive function, memory loss, mood disturbance, and problems focussing. Using CPAP regularly can improve these symptoms.  Keep up the good work! I will see you back in 12 months for sleep apnea check up.   Please call your DME company, [employer] about getting replacement filters.

## 2016-05-18 IMAGING — CR DG CHEST 2V
2 series · 2 of 2 positions shown · non-contrast
Comparison: None.

CLINICAL DATA: Patient with nonproductive cough for 2 months. No
shortness of breath.

EXAM:
CHEST  2 VIEW

[view not recorded (1 of 2)]
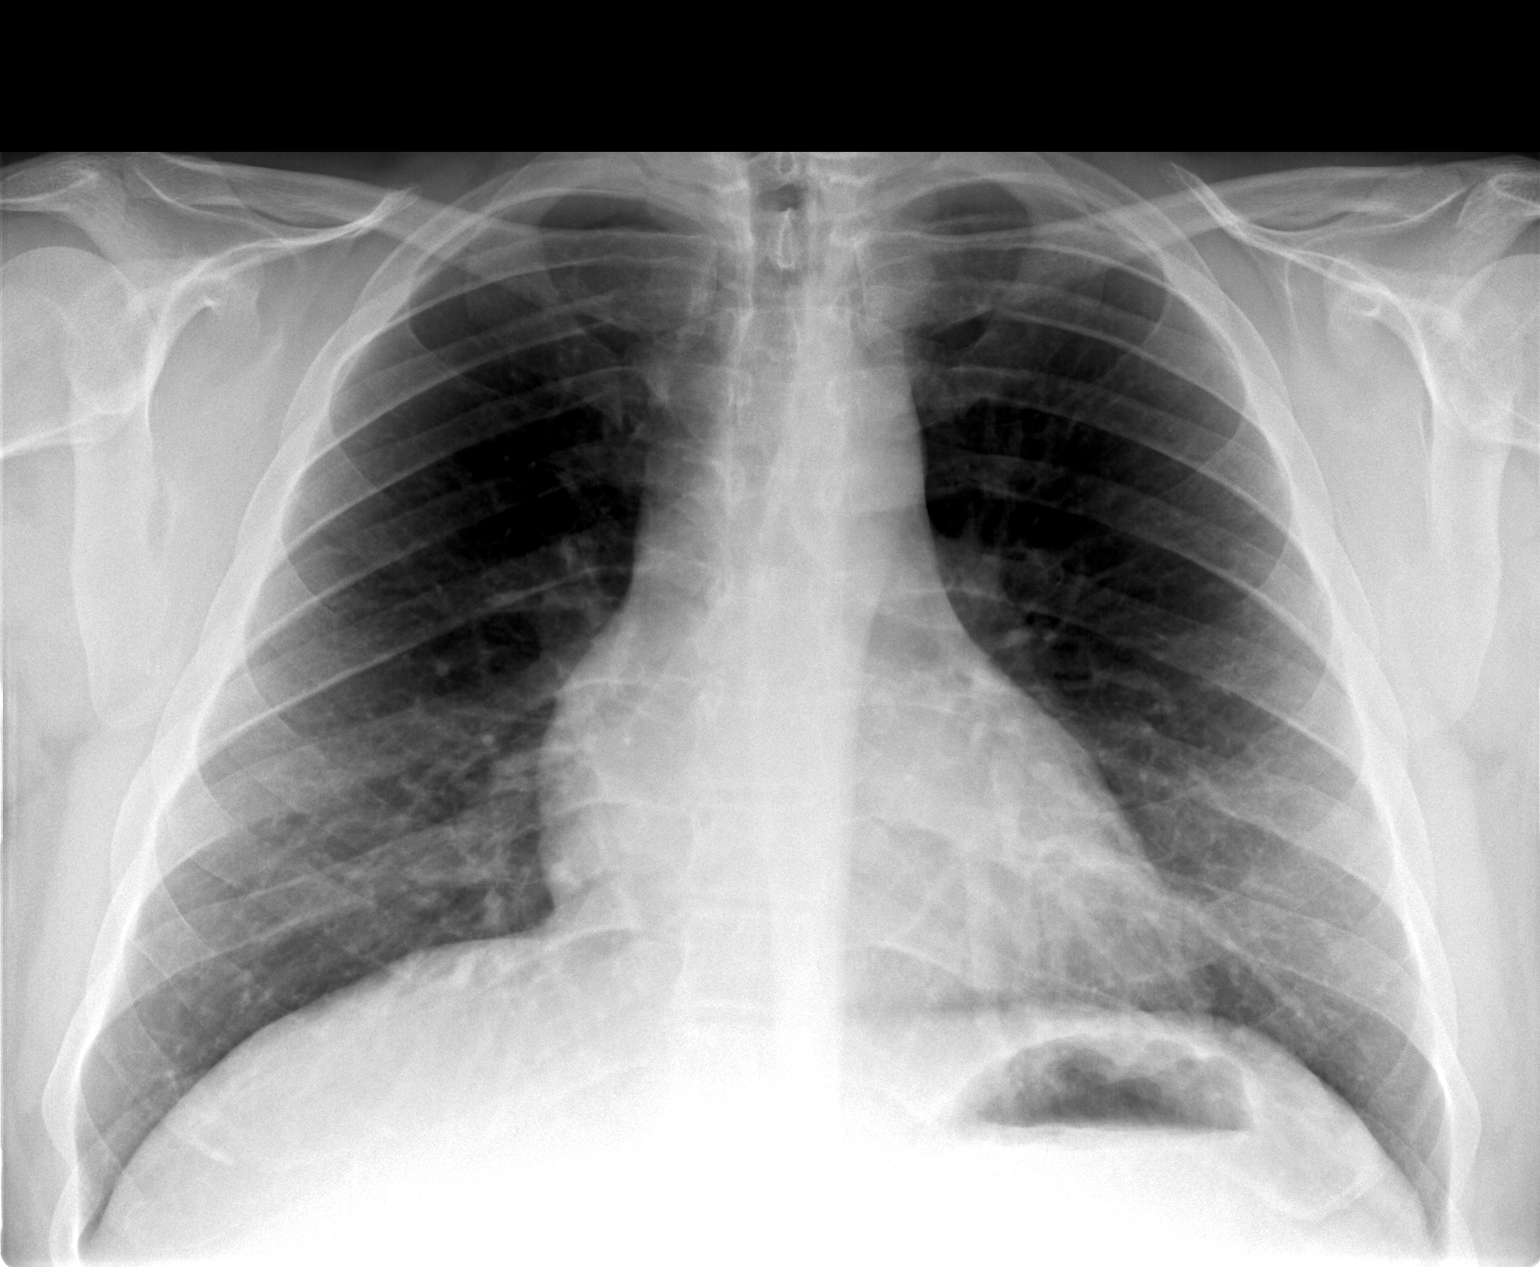

[view not recorded (2 of 2)]
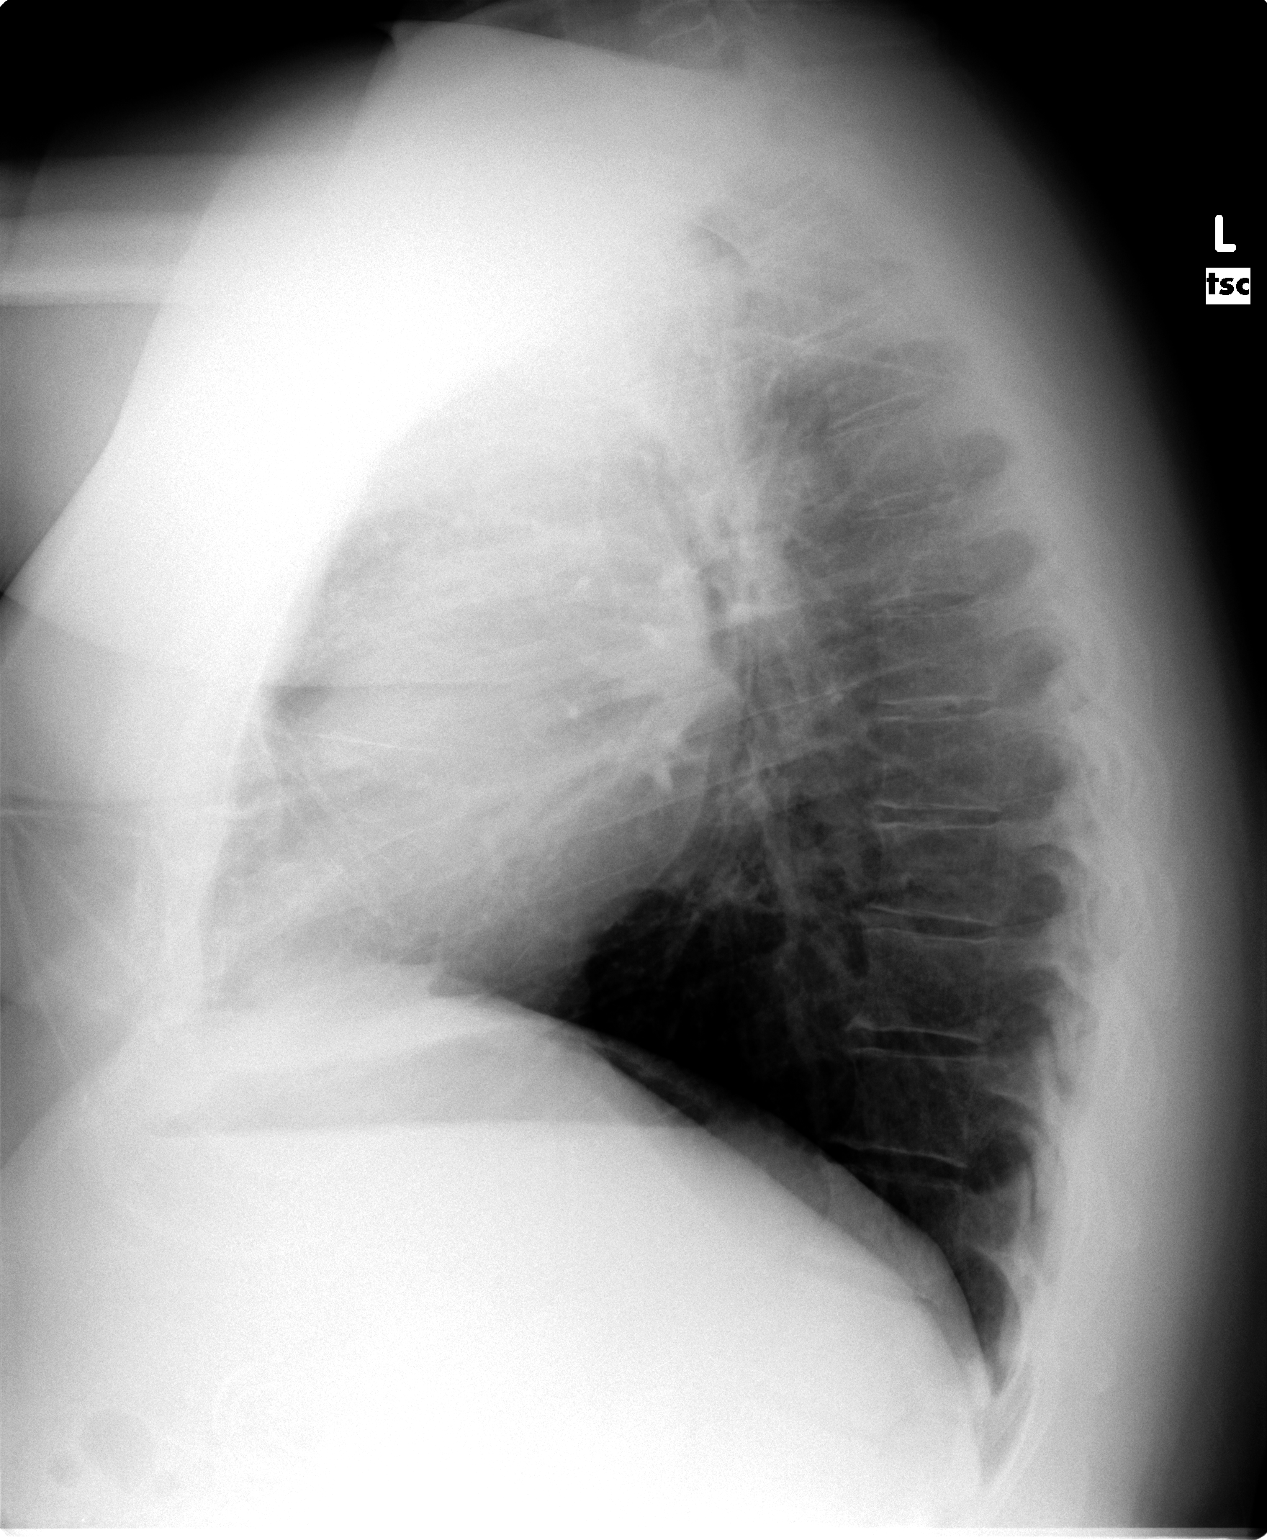

[2 of 2 positions shown; findings below may reference images not displayed]

FINDINGS: Normal cardiac and mediastinal contours. No consolidative pulmonary
opacities. No pleural effusion or pneumothorax. Regional skeleton is
unremarkable.
IMPRESSION: No active cardiopulmonary disease.

## 2016-05-26 ENCOUNTER — Other Ambulatory Visit: Payer: Self-pay | Admitting: Family

## 2016-05-26 MED FILL — OMEPRAZOLE DR 20 MG CAPSULE: 20 | 90 days supply | Qty: 90 | Fill #0

## 2016-05-31 DIAGNOSIS — G4733 Obstructive sleep apnea (adult) (pediatric): Secondary | ICD-10-CM | POA: Diagnosis not present

## 2016-06-02 DIAGNOSIS — F4322 Adjustment disorder with anxiety: Secondary | ICD-10-CM | POA: Diagnosis not present

## 2016-06-16 DIAGNOSIS — F4322 Adjustment disorder with anxiety: Secondary | ICD-10-CM | POA: Diagnosis not present

## 2016-07-21 DIAGNOSIS — F4322 Adjustment disorder with anxiety: Secondary | ICD-10-CM | POA: Diagnosis not present

## 2016-08-11 DIAGNOSIS — F4322 Adjustment disorder with anxiety: Secondary | ICD-10-CM | POA: Diagnosis not present

## 2016-09-01 DIAGNOSIS — F4322 Adjustment disorder with anxiety: Secondary | ICD-10-CM | POA: Diagnosis not present

## 2016-09-22 DIAGNOSIS — F4322 Adjustment disorder with anxiety: Secondary | ICD-10-CM | POA: Diagnosis not present

## 2016-09-25 ENCOUNTER — Other Ambulatory Visit: Payer: Self-pay | Admitting: Family

## 2016-09-25 DIAGNOSIS — G43009 Migraine without aura, not intractable, without status migrainosus: Secondary | ICD-10-CM

## 2016-09-28 ENCOUNTER — Encounter: Payer: Self-pay | Admitting: Family

## 2016-09-28 MED ORDER — ELETRIPTAN HYDROBROMIDE 40 MG PO TABS: 40.0000 mg | ORAL_TABLET | ORAL | 2 refills | Status: DC | PRN

## 2016-09-28 NOTE — Telephone Encounter (Signed)
Per chart patient takes a different medication than this/denied Relpax/thx dmf

## 2016-10-06 DIAGNOSIS — F4322 Adjustment disorder with anxiety: Secondary | ICD-10-CM | POA: Diagnosis not present

## 2016-10-07 MED FILL — ELETRIPTAN HBR 40 MG TABLET: 40 | 30 days supply | Qty: 6 | Fill #0

## 2016-10-15 DIAGNOSIS — Z23 Encounter for immunization: Secondary | ICD-10-CM | POA: Diagnosis not present

## 2016-10-20 DIAGNOSIS — F4322 Adjustment disorder with anxiety: Secondary | ICD-10-CM | POA: Diagnosis not present

## 2016-11-02 DIAGNOSIS — F4322 Adjustment disorder with anxiety: Secondary | ICD-10-CM | POA: Diagnosis not present

## 2016-11-16 DIAGNOSIS — F4322 Adjustment disorder with anxiety: Secondary | ICD-10-CM | POA: Diagnosis not present

## 2016-11-19 MED FILL — ELETRIPTAN HBR 40 MG TABLET: 40 | 30 days supply | Qty: 6 | Fill #1

## 2016-11-24 ENCOUNTER — Other Ambulatory Visit: Payer: Self-pay | Admitting: Family

## 2016-11-24 ENCOUNTER — Telehealth: Payer: 59 | Admitting: Family

## 2016-11-24 DIAGNOSIS — R05 Cough: Secondary | ICD-10-CM

## 2016-11-24 DIAGNOSIS — R059 Cough, unspecified: Secondary | ICD-10-CM

## 2016-11-24 MED ORDER — ALBUTEROL SULFATE HFA 108 (90 BASE) MCG/ACT IN AERS: 2.0000 | INHALATION_SPRAY | Freq: Four times a day (QID) | RESPIRATORY_TRACT | 0 refills | Status: DC | PRN

## 2016-11-24 MED ORDER — PREDNISONE 10 MG (21) PO TBPK: ORAL_TABLET | ORAL | 0 refills | Status: DC

## 2016-11-24 NOTE — Progress Notes (Signed)
We are sorry that you are not feeling well.  Here is how we plan to help!  Based on your presentation I believe you most likely have A cough due to a virus.  This is called viral bronchitis and is best treated by rest, plenty of fluids and control of the cough.  You may use Ibuprofen or Tylenol as directed to help your symptoms.     In addition you may use A non-prescription cough medication called Robitussin DAC. Take 2 teaspoons every 8 hours or Delsym: take 2 teaspoons every 12 hours.  Sterapred 10 mg dosepak. I have also refilled your albuterol inhaler.  From your responses in the eVisit questionnaire you describe inflammation in the upper respiratory tract which is causing a significant cough.  This is commonly called Bronchitis and has four common causes:    Allergies  Viral Infections  Acid Reflux  Bacterial Infection Allergies, viruses and acid reflux are treated by controlling symptoms or eliminating the cause. An example might be a cough caused by taking certain blood pressure medications. You stop the cough by changing the medication. Another example might be a cough caused by acid reflux. Controlling the reflux helps control the cough.  USE OF BRONCHODILATOR ("RESCUE") INHALERS: There is a risk from using your bronchodilator too frequently.  The risk is that over-reliance on a medication which only relaxes the muscles surrounding the breathing tubes can reduce the effectiveness of medications prescribed to reduce swelling and congestion of the tubes themselves.  Although you feel brief relief from the bronchodilator inhaler, your asthma may actually be worsening with the tubes becoming more swollen and filled with mucus.  This can delay other crucial treatments, such as oral steroid medications. If you need to use a bronchodilator inhaler daily, several times per day, you should discuss this with your provider.  There are probably better treatments that could be used to keep your  asthma under control.     HOME CARE . Only take medications as instructed by your medical team. . Complete the entire course of an antibiotic. . Drink plenty of fluids and get plenty of rest. . Avoid close contacts especially the very young and the elderly . Cover your mouth if you cough or cough into your sleeve. . Always remember to wash your hands . A steam or ultrasonic humidifier can help congestion.   GET HELP RIGHT AWAY IF: . You develop worsening fever. . You become short of breath . You cough up blood. . Your symptoms persist after you have completed your treatment plan MAKE SURE YOU   Understand these instructions.  Will watch your condition.  Will get help right away if you are not doing well or get worse.  Your e-visit answers were reviewed by a board certified advanced clinical practitioner to complete your personal care plan.  Depending on the condition, your plan could have included both over the counter or prescription medications. If there is a problem please reply  once you have received a response from your provider. Your safety is important to [place].  If you have drug allergies check your prescription carefully.    You can use MyChart to ask questions about today's visit, request a non-urgent call back, or ask for a work or school excuse for 24 hours related to this e-Visit. If it has been greater than 24 hours you will need to follow up with your provider, or enter a new e-Visit to address those concerns. You will get an e-mail  in the next two days asking about your experience.  I hope that your e-visit has been valuable and will speed your recovery. Thank you for using e-visits.

## 2016-11-27 MED FILL — VENTOLIN HFA 90 MCG INHALER: 108 (90 BAS | 25 days supply | Qty: 18 | Fill #0

## 2016-11-30 DIAGNOSIS — F4322 Adjustment disorder with anxiety: Secondary | ICD-10-CM | POA: Diagnosis not present

## 2016-12-16 MED FILL — ELETRIPTAN HBR 40 MG TABLET: 40 | 30 days supply | Qty: 6 | Fill #2

## 2016-12-17 MED FILL — OMEPRAZOLE 20 MG CAP: 20 | 90 days supply | Qty: 90 | Fill #1

## 2017-01-18 MED FILL — ELETRIPTAN HBR 40 MG TABLET: 40 | 30 days supply | Qty: 6 | Fill #3

## 2017-02-02 DIAGNOSIS — Z0100 Encounter for examination of eyes and vision without abnormal findings: Secondary | ICD-10-CM | POA: Diagnosis not present

## 2017-03-03 MED FILL — ELETRIPTAN HYDROBROMIDE 40: 40 | 30 days supply | Qty: 6 | Fill #4

## 2017-04-14 ENCOUNTER — Other Ambulatory Visit (INDEPENDENT_AMBULATORY_CARE_PROVIDER_SITE_OTHER): Payer: 59

## 2017-04-14 ENCOUNTER — Ambulatory Visit (INDEPENDENT_AMBULATORY_CARE_PROVIDER_SITE_OTHER): Payer: 59 | Admitting: Internal Medicine

## 2017-04-14 ENCOUNTER — Encounter: Payer: Self-pay | Admitting: Internal Medicine

## 2017-04-14 VITALS — BP 148/102 | HR 63 | Temp 97.9°F | Resp 16 | Ht 66.0 in | Wt 262.0 lb

## 2017-04-14 DIAGNOSIS — Z0000 Encounter for general adult medical examination without abnormal findings: Secondary | ICD-10-CM

## 2017-04-14 DIAGNOSIS — Z6841 Body Mass Index (BMI) 40.0 and over, adult: Secondary | ICD-10-CM | POA: Diagnosis not present

## 2017-04-14 DIAGNOSIS — E559 Vitamin D deficiency, unspecified: Secondary | ICD-10-CM | POA: Diagnosis not present

## 2017-04-14 DIAGNOSIS — G4733 Obstructive sleep apnea (adult) (pediatric): Secondary | ICD-10-CM

## 2017-04-14 DIAGNOSIS — I10 Essential (primary) hypertension: Secondary | ICD-10-CM

## 2017-04-14 DIAGNOSIS — Z9989 Dependence on other enabling machines and devices: Secondary | ICD-10-CM | POA: Diagnosis not present

## 2017-04-14 DIAGNOSIS — Z23 Encounter for immunization: Secondary | ICD-10-CM | POA: Diagnosis not present

## 2017-04-14 LAB — COMPREHENSIVE METABOLIC PANEL
ALT: 30 U/L (ref 0–53)
AST: 19 U/L (ref 0–37)
Albumin: 4.6 g/dL (ref 3.5–5.2)
Alkaline Phosphatase: 51 U/L (ref 39–117)
BILIRUBIN TOTAL: 0.7 mg/dL (ref 0.2–1.2)
BUN: 11 mg/dL (ref 6–23)
CHLORIDE: 103 meq/L (ref 96–112)
CO2: 30 meq/L (ref 19–32)
Calcium: 9.7 mg/dL (ref 8.4–10.5)
Creatinine, Ser: 0.82 mg/dL (ref 0.40–1.50)
GFR: 111.75 mL/min (ref >60.00)
GLUCOSE: 85 mg/dL (ref 70–99)
Potassium: 4.4 mEq/L (ref 3.5–5.1)
SODIUM: 141 meq/L (ref 135–145)
Total Protein: 7.5 g/dL (ref 6.0–8.3)

## 2017-04-14 LAB — LIPID PANEL
CHOL/HDL RATIO: 3
CHOLESTEROL: 177 mg/dL (ref 0–200)
HDL: 50.7 mg/dL (ref >39.00)
LDL CALC: 104 mg/dL — AB (ref 0–99)
NonHDL: 126.28
TRIGLYCERIDES: 112 mg/dL (ref 0.0–149.0)
VLDL: 22.4 mg/dL (ref 0.0–40.0)

## 2017-04-14 LAB — URINALYSIS, ROUTINE W REFLEX MICROSCOPIC
BILIRUBIN URINE: NEGATIVE
Hgb urine dipstick: NEGATIVE
Ketones, ur: NEGATIVE
Leukocytes, UA: NEGATIVE
NITRITE: NEGATIVE
RBC / HPF: NONE SEEN (ref 0–?)
Specific Gravity, Urine: 1.01 (ref 1.000–1.030)
Total Protein, Urine: NEGATIVE
URINE GLUCOSE: NEGATIVE
UROBILINOGEN UA: 0.2 (ref 0.0–1.0)
WBC UA: NONE SEEN (ref 0–?)
pH: 7.5 (ref 5.0–8.0)

## 2017-04-14 LAB — CBC WITH DIFFERENTIAL/PLATELET
BASOS ABS: 0.1 10*3/uL (ref 0.0–0.1)
BASOS PCT: 1.1 % (ref 0.0–3.0)
EOS ABS: 0.3 10*3/uL (ref 0.0–0.7)
Eosinophils Relative: 3.4 % (ref 0.0–5.0)
HCT: 43.6 % (ref 39.0–52.0)
Hemoglobin: 14.8 g/dL (ref 13.0–17.0)
Lymphocytes Relative: 28.6 % (ref 12.0–46.0)
Lymphs Abs: 2.8 10*3/uL (ref 0.7–4.0)
MCHC: 34.1 g/dL (ref 30.0–36.0)
MCV: 88.6 fl (ref 78.0–100.0)
MONO ABS: 0.8 10*3/uL (ref 0.1–1.0)
Monocytes Relative: 8.5 % (ref 3.0–12.0)
NEUTROS ABS: 5.7 10*3/uL (ref 1.4–7.7)
Neutrophils Relative %: 58.4 % (ref 43.0–77.0)
PLATELETS: 321 10*3/uL (ref 150.0–400.0)
RBC: 4.92 Mil/uL (ref 4.22–5.81)
RDW: 13 % (ref 11.5–15.5)
WBC: 9.7 10*3/uL (ref 4.0–10.5)

## 2017-04-14 LAB — VITAMIN D 25 HYDROXY (VIT D DEFICIENCY, FRACTURES): VITD: 16.25 ng/mL — AB (ref 30.00–100.00)

## 2017-04-14 MED ORDER — AZILSARTAN MEDOXOMIL 40 MG PO TABS: 1.0000 | ORAL_TABLET | Freq: Every day | ORAL | 0 refills | Status: DC

## 2017-04-14 MED ORDER — CHOLECALCIFEROL 50 MCG (2000 UT) PO TABS: 1.0000 | ORAL_TABLET | Freq: Every day | ORAL | 1 refills | Status: AC

## 2017-04-14 NOTE — Progress Notes (Signed)
Subjective:  Patient ID: Adam Dunn, male    DOB: 01-Aug-1980  Age: 37 y.o. MRN: [id number]  CC: Hypertension and Annual Exam  NEW TO ME  HPI Adam Dunn presents for a CPX.  He complains of weight gain.  He has sleep apnea and is being treated with CPAP.  He denies any recent episodes of headache/blurred vision/chest pain/shortness of breath/cough/edema/or wheezing.  Outpatient Medications Prior to Visit  Medication Sig Dispense Refill  . albuterol (PROVENTIL HFA;VENTOLIN HFA) 108 (90 Base) MCG/ACT inhaler Inhale 2 puffs into the lungs every 6 (six) hours as needed for wheezing or shortness of breath. 1 Inhaler 0  . eletriptan (RELPAX) 40 MG tablet Take 1 tablet (40 mg total) by mouth as needed for migraine or headache. May repeat in 2 hours if headache persists or recurs. 10 tablet 2  . omeprazole (PRILOSEC) 20 MG capsule TAKE 1 CAPSULE BY MOUTH DAILY AS NEEDED. 90 capsule 1  . albuterol (PROVENTIL HFA;VENTOLIN HFA) 108 (90 Base) MCG/ACT inhaler Inhale 2 puffs into the lungs every 6 (six) hours as needed for wheezing or shortness of breath. 1 Inhaler 0  . HYDROcodone-homatropine (HYCODAN) 5-1.5 MG/5ML syrup Take 5 mLs by mouth every 8 (eight) hours as needed for cough. 120 mL 0  . omeprazole (PRILOSEC OTC) 20 MG tablet Take 1 tablet (20 mg total) by mouth daily as needed. 90 tablet 1  . ondansetron (ZOFRAN-ODT) 4 MG disintegrating tablet Take 1 tablet (4 mg total) by mouth every 8 (eight) hours as needed for nausea or vomiting. 30 tablet 0  . predniSONE (STERAPRED UNI-PAK 21 TAB) 10 MG (21) TBPK tablet dosepak as directed 21 tablet 0  . propranolol ER (INDERAL LA) 120 MG 24 hr capsule Take 1 capsule (120 mg total) by mouth daily. (Patient not taking: Reported on 05/12/2016) 30 capsule 3  . rizatriptan (MAXALT) 5 MG tablet Take 1 tablet (5 mg total) by mouth as needed for migraine. May repeat in 2 hours if needed 10 tablet 0   No facility-administered medications prior to visit.      ROS Review of Systems  Constitutional: Negative.  Negative for diaphoresis, fatigue and unexpected weight change.  HENT: Negative.   Eyes: Negative for visual disturbance.  Respiratory: Positive for apnea. Negative for cough, chest tightness, shortness of breath and wheezing.   Cardiovascular: Negative.  Negative for chest pain, palpitations and leg swelling.  Gastrointestinal: Negative for abdominal pain, constipation, diarrhea, nausea and vomiting.  Endocrine: Negative.   Genitourinary: Negative for difficulty urinating, penile swelling, scrotal swelling, testicular pain and urgency.  Musculoskeletal: Negative.  Negative for back pain and neck pain.  Skin: Negative.   Neurological: Negative for dizziness, weakness, light-headedness, numbness and headaches.  Hematological: Negative for adenopathy. Does not bruise/bleed easily.  Psychiatric/Behavioral: Negative.     Objective:  BP (!) 148/102 (BP Location: Left Arm, Patient Position: Sitting, Cuff Size: Large) Comment: BP (R) 144/100 (L) 140/100  Pulse 63   Temp 97.9 F (36.6 C) (Oral)   Resp 16   Ht 5' 6"  (1.676 m)   Wt 262 lb (118.8 kg)   SpO2 97%   BMI 42.29 kg/m   BP Readings from Last 3 Encounters:  04/14/17 (!) 148/102  05/12/16 138/78  03/18/16 122/80    Wt Readings from Last 3 Encounters:  04/14/17 262 lb (118.8 kg)  05/12/16 254 lb (115.2 kg)  03/18/16 246 lb (111.6 kg)    Physical Exam  Constitutional: He is oriented to person, place,  and time. No distress.  HENT:  Mouth/Throat: No oropharyngeal exudate.  Neck: Normal range of motion. Neck supple. No JVD present. No thyromegaly present.  Cardiovascular: Normal rate, regular rhythm, normal heart sounds and intact distal pulses. Exam reveals no gallop and no friction rub.  No murmur heard. EKG ---  Sinus  Rhythm  WITHIN NORMAL LIMITS   Pulmonary/Chest: Effort normal and breath sounds normal. No stridor. No respiratory distress. He has no wheezes. He  has no rales.  Abdominal: Soft. Bowel sounds are normal. He exhibits no distension and no mass. There is no tenderness. There is no rebound and no guarding. No hernia.  Musculoskeletal: Normal range of motion. He exhibits no edema, tenderness or deformity.  Lymphadenopathy:    He has no cervical adenopathy.  Neurological: He is alert and oriented to person, place, and time.  Skin: Skin is warm and dry. He is not diaphoretic.  Psychiatric: He has a normal mood and affect.  Vitals reviewed.   Lab Results  Component Value Date   WBC 9.7 04/14/2017   HGB 14.8 04/14/2017   HCT 43.6 04/14/2017   PLT 321.0 04/14/2017   GLUCOSE 85 04/14/2017   CHOL 177 04/14/2017   TRIG 112.0 04/14/2017   HDL 50.70 04/14/2017   LDLCALC 104 (H) 04/14/2017   ALT 30 04/14/2017   AST 19 04/14/2017   NA 141 04/14/2017   K 4.4 04/14/2017   CL 103 04/14/2017   CREATININE 0.82 04/14/2017   BUN 11 04/14/2017   CO2 30 04/14/2017   TSH 2.55 04/14/2017   HGBA1C 5.3 11/13/2015    Dg Chest 2 View  Result Date: 03/18/2016 CLINICAL DATA:  Cough for 4 days EXAM: CHEST  2 VIEW COMPARISON:  Chest x-ray of 10/17/2014 FINDINGS: No pneumonia or effusion is seen. Mediastinal and hilar contours are unremarkable. There is mild peribronchial thickening which may indicate bronchitis. Mediastinal and hilar contours are unremarkable. The heart is within normal limits in size. No bony abnormality is seen. IMPRESSION: No pneumonia or effusion.  Question bronchitis. Electronically Signed   By: Ivar Drape M.D.   On: 03/18/2016 16:38    Assessment & Plan:   Darreld was seen today for hypertension and annual exam.  Diagnoses and all orders for this visit:  BMI 40.0-44.9, adult ([place])- He agrees to work on his lifestyle modifications to lose weight. -     Thyroid Panel With TSH; Future  Essential hypertension- He has new onset stage II hypertension.  The only remarkable finding on his lab work is vitamin D deficiency which I will  treat.  Otherwise, there is no evidence of secondary causes or endorgan damage.  His EKG is negative for LVH.  I will start treating this with an ARB. -     Comprehensive metabolic panel; Future -     CBC with Differential/Platelet; Future -     Thyroid Panel With TSH; Future -     Urinalysis, Routine w reflex microscopic; Future -     VITAMIN D 25 Hydroxy (Vit-D Deficiency, Fractures); Future -     EKG 12-Lead -     Azilsartan Medoxomil (EDARBI) 40 MG TABS; Take 1 tablet by mouth daily.  Routine general medical examination at a health care facility- Exam completed, labs reviewed, vaccines reviewed and updated, patient education material was given. -     Lipid panel; Future  OSA on CPAP  Need for Tdap vaccination -     Tdap vaccine greater than or equal to 7yo  IM  Vitamin D deficiency -     Cholecalciferol 2000 units TABS; Take 1 tablet (2,000 Units total) by mouth daily.   I have discontinued Mujahid P. Derossett's propranolol ER, HYDROcodone-homatropine, rizatriptan, ondansetron, and predniSONE. I am also having him start on Azilsartan Medoxomil and Cholecalciferol. Additionally, I am having him maintain his albuterol, omeprazole, and eletriptan.  Meds ordered this encounter  Medications  . Azilsartan Medoxomil (EDARBI) 40 MG TABS    Sig: Take 1 tablet by mouth daily.    Dispense:  49 tablet    Refill:  0  . Cholecalciferol 2000 units TABS    Sig: Take 1 tablet (2,000 Units total) by mouth daily.    Dispense:  90 tablet    Refill:  1     Follow-up: Return in about 6 weeks (around 05/26/2017).  Scarlette Calico, MD

## 2017-04-14 NOTE — Patient Instructions (Signed)

## 2017-04-15 LAB — THYROID PANEL WITH TSH
Free Thyroxine Index: 2.5 (ref 1.4–3.8)
T3 UPTAKE: 31 % (ref 22–35)
T4 TOTAL: 8.1 ug/dL (ref 4.9–10.5)
TSH: 2.55 mIU/L (ref 0.40–4.50)

## 2017-05-05 ENCOUNTER — Encounter: Payer: Self-pay | Admitting: Internal Medicine

## 2017-05-06 MED ORDER — ELETRIPTAN HYDROBROMIDE 40 MG PO TABS: 40.0000 mg | ORAL_TABLET | ORAL | 2 refills | Status: DC | PRN

## 2017-05-06 MED FILL — ELETRIPTAN HYDROBROMIDE 40: 40 | 30 days supply | Qty: 10 | Fill #0

## 2017-05-11 DIAGNOSIS — Z113 Encounter for screening for infections with a predominantly sexual mode of transmission: Secondary | ICD-10-CM | POA: Diagnosis not present

## 2017-05-17 ENCOUNTER — Encounter: Payer: Self-pay | Admitting: Neurology

## 2017-05-19 ENCOUNTER — Ambulatory Visit: Payer: 59 | Admitting: Neurology

## 2017-05-19 ENCOUNTER — Encounter: Payer: Self-pay | Admitting: Internal Medicine

## 2017-05-19 ENCOUNTER — Encounter: Payer: Self-pay | Admitting: Neurology

## 2017-05-19 VITALS — BP 156/100 | HR 78 | Ht 66.0 in | Wt 256.0 lb

## 2017-05-19 DIAGNOSIS — Z9989 Dependence on other enabling machines and devices: Secondary | ICD-10-CM

## 2017-05-19 DIAGNOSIS — G4733 Obstructive sleep apnea (adult) (pediatric): Secondary | ICD-10-CM

## 2017-05-19 NOTE — Progress Notes (Signed)
Subjective:    Patient ID: Adam Dunn is a 37 y.o. male.  HPI     Interim history:   Adam Dunn is a 37 year old gentleman with an underlying medical history of migraine headaches, paresthesias, vitamin D deficiency, anxiety, reflux disease and morbid obesity, who presents for follow-up consultation of his obstructive sleep apnea, on CPAP therapy. The patient is unaccompanied today. I last saw him on 05/12/2016, at which time we talked about his two sleep studies. He had started CPAP therapy and felt improved particularly with regards to his headaches and even his left arm paresthesias. He was fully compliant with CPAP therapy and advised to follow-up in one year routinely.   Today, 05/19/2017 (all dictated new, as well as above notes, some dictation done in note pad or Word, outside of chart, may appear as copied):   I reviewed his CPAP compliance data from 04/18/2017 through 05/17/2017 which is a total of 30 days, during which time he used his CPAP every night with percent used days greater than 4 hours at 100%, indicating superb compliance with an average usage of 7 hours and 21 minutes, residual AHI at goal at 0.8 per hour, leak on the low side with the 95th percentile at 4 L/m at a pressure of 6 cm with EPR of 3. He reports doing well. Continues to benefit from his CPAP. He may need new supplies. His DME company is [employer]. He uses nasal pillows. He has had ongoing benefit with regards to his headaches as well. He does not have morning headaches nearly as frequently as he used to. He uses Relpax as needed generic. He has not had to see Dr. Leta Baptist.   The patient's allergies, current medications, family history, past medical history, past social history, past surgical history and problem list were reviewed and updated as appropriate.    Previously (copied from previous notes for reference):   I first met him on 12/10/2015 at the request of Dr. Leta Baptist, at which time the patient  reported snoring and excessive daytime somnolence as well as recurrent morning headaches. I invited him for sleep study testing. He had a baseline sleep study, followed by a CPAP titration study. I went over his test results with him in detail today. He had a baseline sleep study on 01/02/2016 which showed a sleep efficiency of 86.1%, sleep latency was 14 minutes, REM latency was 74.5 minutes, he had a increased percentage of stage III sleep, REM sleep was 15.3%, mild snoring was noted. EKG showed rare PVCs. Total AHI was 10 per hour, REM AHI was 37.6 per hour, supine AHI was 8.3 per hour. Average oxygen saturation was 96%, nadir was 70% and supine REM sleep. He had no significant PLMS. Based on his medical history and sleep related complaints as well as well as significant desaturations during REM sleep he was invited for a full night CPAP titration study. He had this on 02/12/2016. Sleep efficiency was 78.1%, sleep latency was prolonged at 99 minutes, REM latency borderline at 119.5 minutes. He had an increased percentage of stage III sleep, REM sleep was reduced at 10.1%. CPAP was titrated from 5 cm to 6 cm. AHI was 0 per hour on the final pressure with supine REM sleep achieved and O2 nadir of 89%. Based on his test results I prescribed CPAP therapy for home use.   I reviewed his CPAP compliance data from 04/11/2016 through 05/10/2016, which is a total of 30 days, during which time he used CPAP every  night with percent used days greater than 4 hours at 100%, indicating superb compliance with an average usage of 7 hours and 42 minutes, residual AHI 0.9 per hour, leak low with the 95th percentile at 1.1 L/m and a pressure of 6 cm with EPR.    12/10/2015: He reports snoring and excessive daytime somnolence as well as recurrent morning headaches. His Epworth sleepiness score is 8 out of 24 today, his fatigue score is 26 out of 63. I reviewed your office note from 11/13/2015. The patient is married and lives  with his wife and 3 children. He works full-time. He is a nonsmoker. He drinks alcohol occasionally. He drinks caffeine daily in the form of coffee or soda, up to 3 per day. He states he had a sleep study some 6 years ago which per his report was borderline. I reviewed his home sleep test report from 11/06/2009, which showed an AHI of 5 per hour, O2 nadir was 67%, mean in an effort, time below 90% saturation was 35 minutes for the night. Total testing time was 13 hours and 5 minutes which is unusual. He has gained about 15 pounds since his home sleep test some 6 years ago and weight has been fluctuating quite a bit as he can lose weight when he really tries hard and has lost down to the mid 170s one time in the past 5 years. He has 3 young children, ages 72, 79 and 68 yo. Typical bedtime is around midnight, wakeup time around 7- 7:15 AM. He has occasional morning headaches. His migraines often happen in the first part of the day and start at the base of his head or posterior neck area. He had a recent brain MRI which showed benign findings. He has had intermittent paresthesias in the upper extremities, left more than right. He does have intermittent restless leg symptoms, is not sure if he twitches or kicks his legs in his sleep. He does report sleep disruption, he denies nocturia.   His Past Medical History Is Significant For: Past Medical History:  Diagnosis Date  . Migraines     His Past Surgical History Is Significant For: Past Surgical History:  Procedure Laterality Date  . HERNIA REPAIR     infant    His Family History Is Significant For: Family History  Problem Relation Age of Onset  . Hypertension Mother   . Migraines Mother   . Fibromyalgia Mother   . Arthritis Mother   . Healthy Father   . Healthy Brother   . Coronary artery disease Maternal Grandfather   . Emphysema Maternal Grandfather   . Colon cancer Neg Hx   . Prostate cancer Neg Hx     His Social History Is Significant  For: Social History   Socioeconomic History  . Marital status: Married    Spouse name: Adam Dunn  . Number of children: 3  . Years of education: 68  . Highest education level: Not on file  Occupational History  . Occupation: Cabin crew    Comment: self employed  Social Needs  . Financial resource strain: Not on file  . Food insecurity:    Worry: Not on file    Inability: Not on file  . Transportation needs:    Medical: Not on file    Non-medical: Not on file  Tobacco Use  . Smoking status: Never Smoker  . Smokeless tobacco: Never Used  . Tobacco comment: 11/13/15 very very occas cigar  Substance and Sexual Activity  . Alcohol  use: Yes    Comment: 11/13/15 1 a week  . Drug use: No  . Sexual activity: Not on file  Lifestyle  . Physical activity:    Days per week: Not on file    Minutes per session: Not on file  . Stress: Not on file  Relationships  . Social connections:    Talks on phone: Not on file    Gets together: Not on file    Attends religious service: Not on file    Active member of club or organization: Not on file    Attends meetings of clubs or organizations: Not on file    Relationship status: Not on file  Other Topics Concern  . Not on file  Social History Narrative   Lives at home with wife, children   Caffeine- 2-3 daily-coffee, soda    His Allergies Are:  Allergies  Allergen Reactions  . Sulfamethoxazole-Trimethoprim Other (See Comments)    REACTION: Pt does not remember, was an infant  :   His Current Medications Are:  Outpatient Encounter Medications as of 05/19/2017  Medication Sig  . albuterol (PROVENTIL HFA;VENTOLIN HFA) 108 (90 Base) MCG/ACT inhaler Inhale 2 puffs into the lungs every 6 (six) hours as needed for wheezing or shortness of breath.  . Cholecalciferol 2000 units TABS Take 1 tablet (2,000 Units total) by mouth daily.  Marland Kitchen eletriptan (RELPAX) 40 MG tablet Take 1 tablet (40 mg total) by mouth as needed for migraine or headache. May  repeat in 2 hours if headache persists or recurs.  Marland Kitchen omeprazole (PRILOSEC) 20 MG capsule TAKE 1 CAPSULE BY MOUTH DAILY AS NEEDED.  . Azilsartan Medoxomil (EDARBI) 40 MG TABS Take 1 tablet by mouth daily. (Patient not taking: Reported on 05/19/2017)   No facility-administered encounter medications on file as of 05/19/2017.   :  Review of Systems:  Out of a complete 14 point review of systems, all are reviewed and negative with the exception of these symptoms as listed below:  Review of Systems  Neurological:       Pt presents today to discuss his cpap. Pt reports that his cpap is going well.    Objective:  Neurological Exam  Physical Exam Physical Examination:   Vitals:   05/19/17 1358  BP: (!) 156/100  Pulse: 78   General Examination: The patient is a very pleasant 37 y.o. male in no acute distress. He appears well-developed and well-nourished and well groomed. He reports having typically higher blood pressure values when he goes to the doctor's office, he has a blood pressure log with him which shows fairly normal values in the past couple weeks.  HEENT:Normocephalic, atraumatic, pupils are equal, round and reactive to light and accommodation. Extraocular tracking is good without limitation to gaze excursion or nystagmus noted. Normal smooth pursuit is noted. Hearing is grossly intact. Face is symmetric with normal facial animation and normal facial sensation. Speech is clear with no dysarthria noted. There is no hypophonia. There is no lip, neck/head, jaw or voice tremor. Neck is supple with full range of passive and active motion. There are no carotid bruits on auscultation. Oropharynx exam reveals: mildmouth dryness, gooddental hygiene and moderateairway crowding. Tongue protrudes centrally and palate elevates symmetrically.   Chest:Clear to auscultation without wheezing, rhonchi or crackles noted.  Heart:S1+S2+0, regular and normal without murmurs, rubs or gallops noted.    Abdomen:Soft, non-tender and non-distended with normal bowel sounds appreciated on auscultation.  Extremities:There is nopitting edema in the distal lower extremities bilaterally.  Skin: Warm and dry without trophic changes noted.   Musculoskeletal: exam reveals no obvious joint deformities, tenderness or joint swelling or erythema.   Neurologically:  Mental status: The patient is awake, alert and oriented in all 4 spheres. Hisimmediate and remote memory, attention, language skills and fund of knowledge are appropriate. There is no evidence of aphasia, agnosia, apraxia or anomia. Speech is clear with normal prosody and enunciation. Thought process is linear. Mood is normaland affect is normal.  Cranial nerves II - XII are as described above under HEENT exam. In addition: shoulder shrug is normal with equal shoulder height noted. Motor exam: Normal bulk, strength and tone is noted. There is no tremor. Fine motor skills and coordination: intact grossly.   Cerebellar testing: No dysmetria or intention tremor. There is no truncal or gait ataxia.  Sensory exam: intact to light touch in the upper and lower extremities.  Gait, station and balance: Hestands easily. No veering to one side is noted. No leaning to one side is noted. Posture is age-appropriate and stance is narrow based. Gait shows normalstride length and normalpace. No problems turning are noted.   Assessment and Plan:  In summary, Adam Dunn a very pleasant 37 year old LH malewith an underlying medical history of migraine headaches, paresthesias, anxiety, reflux disease and morbid obesity, who presents for follow-up consultation of his obstructive sleep apnea, which ranges from mild to severe in REM sleep. He had a baseline sleep study in December 2017 and a CPAP titration study in the. 2018. He is fully compliant with his CPAP and commended for his ongoing excellent treatment adherence. He continues to benefit from  it. He had noticed improvement in his migraine headaches and morning headaches as well as in his paresthesias. Physical exam is stable. He has had some weight fluctuation but stable from last year. He is advised to be mindful of changing his supplies in a timely manner including the filter. I renewed a prescription for his CPAP supplies and we will fax this to his DME company. I suggested a one-year checkup, he can see one of our NPs next time. I answered all his questions today and he was in agreement.I spent 20 minutes in total face-to-face time with the patient, more than 50% of which was spent in counseling and coordination of care, reviewing test results, reviewing medication and discussing or reviewing the diagnosis of OSA, its prognosis and treatment options. Pertinent laboratory and imaging test results that were available during this visit with the patient were reviewed by me and considered in my medical decision making (see chart for details).

## 2017-05-19 NOTE — Patient Instructions (Addendum)
Please continue using your CPAP regularly. While your insurance requires that you use CPAP at least 4 hours each night on 70% of the nights, I recommend, that you not skip any nights and use it throughout the night if you can. Getting used to CPAP and staying with the treatment long term does take time and patience and discipline. Untreated obstructive sleep apnea when it is moderate to severe can have an adverse impact on cardiovascular health and raise her risk for heart disease, arrhythmias, hypertension, congestive heart failure, stroke and diabetes. Untreated obstructive sleep apnea causes sleep disruption, nonrestorative sleep, and sleep deprivation. This can have an impact on your day to day functioning and cause daytime sleepiness and impairment of cognitive function, memory loss, mood disturbance, and problems focussing. Using CPAP regularly can improve these symptoms.  Keep up the good work! We can see you in 1 year, you can see one of our nurse practitioners as you are stable. I will see you after that.   You have continued to do well on CPAP, please be sure to change your filter every 1-2 months, your mask about every 2-3 months, hose about 6 months, humidifier chamber about yearly. Some restrictions are imposed by her insurance carrier with regard to how frequently you can get certain supplies.

## 2017-05-21 DIAGNOSIS — G4733 Obstructive sleep apnea (adult) (pediatric): Secondary | ICD-10-CM | POA: Diagnosis not present

## 2017-05-24 ENCOUNTER — Other Ambulatory Visit: Payer: Self-pay | Admitting: Internal Medicine

## 2017-05-24 DIAGNOSIS — G43109 Migraine with aura, not intractable, without status migrainosus: Secondary | ICD-10-CM

## 2017-05-24 MED ORDER — ELETRIPTAN HYDROBROMIDE 40 MG PO TABS: 40.0000 mg | ORAL_TABLET | ORAL | 2 refills | Status: DC | PRN

## 2017-06-01 MED FILL — ELETRIPTAN HYDROBROMIDE 40: 40 | 30 days supply | Qty: 12 | Fill #0

## 2017-06-07 DIAGNOSIS — Z31440 Encounter of male for testing for genetic disease carrier status for procreative management: Secondary | ICD-10-CM | POA: Diagnosis not present

## 2017-06-08 ENCOUNTER — Encounter: Payer: Self-pay | Admitting: Internal Medicine

## 2017-06-08 ENCOUNTER — Ambulatory Visit: Payer: 59 | Admitting: Internal Medicine

## 2017-06-08 VITALS — BP 136/80 | HR 70 | Temp 98.1°F | Resp 16 | Ht 66.0 in | Wt 255.0 lb

## 2017-06-08 DIAGNOSIS — I10 Essential (primary) hypertension: Secondary | ICD-10-CM | POA: Diagnosis not present

## 2017-06-08 DIAGNOSIS — G43109 Migraine with aura, not intractable, without status migrainosus: Secondary | ICD-10-CM

## 2017-06-08 MED ORDER — ONDANSETRON HCL 8 MG PO TABS: 8.0000 mg | ORAL_TABLET | Freq: Three times a day (TID) | ORAL | 5 refills | Status: DC | PRN

## 2017-06-08 MED FILL — ONDANSETRON HCL 8 MG TABLET: 8 | 7 days supply | Qty: 20 | Fill #0

## 2017-06-08 NOTE — Patient Instructions (Signed)

## 2017-06-08 NOTE — Progress Notes (Signed)
Subjective:  Patient ID: Adam Dunn, male    DOB: 05/24/1980  Age: 37 y.o. MRN: [id number]  CC: Hypertension   HPI Adam Dunn presents for f/up - He tells me his blood pressure has been well controlled and he has felt well recently.  Since I last saw him he has been treated for vitamin D deficiency and sleep apnea.  He has also lost weight with lifestyle modifications.  He decided not to take the ARB.  He has migraine headaches several times throughout the month.  He wants a refill on Zofran to treat the nausea and vomiting associated with the migraines.  The migraines are otherwise well controlled with triptan's.  Outpatient Medications Prior to Visit  Medication Sig Dispense Refill  . albuterol (PROVENTIL HFA;VENTOLIN HFA) 108 (90 Base) MCG/ACT inhaler Inhale 2 puffs into the lungs every 6 (six) hours as needed for wheezing or shortness of breath. 1 Inhaler 0  . Cholecalciferol 2000 units TABS Take 1 tablet (2,000 Units total) by mouth daily. 90 tablet 1  . eletriptan (RELPAX) 40 MG tablet Take 1 tablet (40 mg total) by mouth as needed for migraine or headache. May repeat in 2 hours if headache persists or recurs. 15 tablet 2  . omeprazole (PRILOSEC) 20 MG capsule TAKE 1 CAPSULE BY MOUTH DAILY AS NEEDED. 90 capsule 1  . Azilsartan Medoxomil (EDARBI) 40 MG TABS Take 1 tablet by mouth daily. 49 tablet 0   No facility-administered medications prior to visit.     ROS Review of Systems  Constitutional: Negative for diaphoresis, fatigue and unexpected weight change.  HENT: Negative.   Eyes: Negative for visual disturbance.  Respiratory: Positive for apnea. Negative for cough, chest tightness and shortness of breath.   Cardiovascular: Negative.  Negative for chest pain, palpitations and leg swelling.  Gastrointestinal: Negative for abdominal pain, constipation, diarrhea, nausea and vomiting.  Genitourinary: Negative.  Negative for difficulty urinating.  Musculoskeletal: Negative.   Negative for arthralgias and myalgias.  Skin: Negative.  Negative for color change.  Allergic/Immunologic: Negative.   Neurological: Negative.  Negative for dizziness, weakness and headaches.  Hematological: Negative for adenopathy. Does not bruise/bleed easily.  Psychiatric/Behavioral: Negative.     Objective:  BP 136/80 (BP Location: Left Arm, Patient Position: Sitting, Cuff Size: Large)   Pulse 70   Temp 98.1 F (36.7 C) (Oral)   Resp 16   Ht 5' 6"  (1.676 m)   Wt 255 lb (115.7 kg)   SpO2 99%   BMI 41.16 kg/m   BP Readings from Last 3 Encounters:  06/08/17 136/80  05/19/17 (!) 156/100  04/14/17 (!) 148/102    Wt Readings from Last 3 Encounters:  06/08/17 255 lb (115.7 kg)  05/19/17 256 lb (116.1 kg)  04/14/17 262 lb (118.8 kg)    Physical Exam  Constitutional: He is oriented to person, place, and time. No distress.  HENT:  Mouth/Throat: Oropharynx is clear and moist. No oropharyngeal exudate.  Eyes: Conjunctivae are normal. No scleral icterus.  Neck: Normal range of motion. Neck supple. No JVD present. No thyromegaly present.  Cardiovascular: Normal rate, regular rhythm and normal heart sounds. Exam reveals no gallop.  No murmur heard. Pulmonary/Chest: Effort normal and breath sounds normal. He has no wheezes. He has no rales.  Abdominal: Soft. He exhibits no mass. There is no hepatosplenomegaly. There is no tenderness.  Musculoskeletal: Normal range of motion. He exhibits no edema, tenderness or deformity.  Lymphadenopathy:    He has no cervical adenopathy.  Neurological: He is alert and oriented to person, place, and time.  Skin: Skin is warm. He is not diaphoretic.  Vitals reviewed.   Lab Results  Component Value Date   WBC 9.7 04/14/2017   HGB 14.8 04/14/2017   HCT 43.6 04/14/2017   PLT 321.0 04/14/2017   GLUCOSE 85 04/14/2017   CHOL 177 04/14/2017   TRIG 112.0 04/14/2017   HDL 50.70 04/14/2017   LDLCALC 104 (H) 04/14/2017   ALT 30 04/14/2017   AST  19 04/14/2017   NA 141 04/14/2017   K 4.4 04/14/2017   CL 103 04/14/2017   CREATININE 0.82 04/14/2017   BUN 11 04/14/2017   CO2 30 04/14/2017   TSH 2.55 04/14/2017   HGBA1C 5.3 11/13/2015    Dg Chest 2 View  Result Date: 03/18/2016 CLINICAL DATA:  Cough for 4 days EXAM: CHEST  2 VIEW COMPARISON:  Chest x-ray of 10/17/2014 FINDINGS: No pneumonia or effusion is seen. Mediastinal and hilar contours are unremarkable. There is mild peribronchial thickening which may indicate bronchitis. Mediastinal and hilar contours are unremarkable. The heart is within normal limits in size. No bony abnormality is seen. IMPRESSION: No pneumonia or effusion.  Question bronchitis. Electronically Signed   By: Ivar Drape M.D.   On: 03/18/2016 16:38    Assessment & Plan:   Namir was seen today for hypertension.  Diagnoses and all orders for this visit:  Migraine with aura and without status migrainosus, not intractable -     ondansetron (ZOFRAN) 8 MG tablet; Take 1 tablet (8 mg total) by mouth every 8 (eight) hours as needed for nausea or vomiting.  Essential hypertension- His blood pressure is adequately well controlled without the ARB.  He will continue to work on his lifestyle modifications.   I have discontinued Adam Dunn's Azilsartan Medoxomil. I am also having him start on ondansetron. Additionally, I am having him maintain his albuterol, omeprazole, Cholecalciferol, and eletriptan.  Meds ordered this encounter  Medications  . ondansetron (ZOFRAN) 8 MG tablet    Sig: Take 1 tablet (8 mg total) by mouth every 8 (eight) hours as needed for nausea or vomiting.    Dispense:  20 tablet    Refill:  5     Follow-up: Return in about 6 months (around 12/08/2017).  Scarlette Calico, MD

## 2017-07-06 MED FILL — ELETRIPTAN HYDROBROMIDE 40: 40 | 30 days supply | Qty: 12 | Fill #1

## 2017-07-20 ENCOUNTER — Encounter: Payer: Self-pay | Admitting: Internal Medicine

## 2017-07-20 MED ORDER — OMEPRAZOLE 20 MG PO CPDR: 20.0000 mg | DELAYED_RELEASE_CAPSULE | Freq: Every day | ORAL | 1 refills | Status: DC | PRN

## 2017-07-20 MED ORDER — ALBUTEROL SULFATE HFA 108 (90 BASE) MCG/ACT IN AERS: 2.0000 | INHALATION_SPRAY | Freq: Four times a day (QID) | RESPIRATORY_TRACT | 0 refills | Status: DC | PRN

## 2017-07-20 MED FILL — VENTOLIN HFA 90 MCG INHALER: 108 (90 BAS | 17 days supply | Qty: 18 | Fill #0

## 2017-07-20 MED FILL — OMEPRAZOLE 20 MG CAP: 20 | 90 days supply | Qty: 90 | Fill #0

## 2017-08-18 ENCOUNTER — Other Ambulatory Visit: Payer: Self-pay | Admitting: Internal Medicine

## 2017-08-18 ENCOUNTER — Encounter: Payer: Self-pay | Admitting: Internal Medicine

## 2017-08-18 DIAGNOSIS — I10 Essential (primary) hypertension: Secondary | ICD-10-CM

## 2017-08-18 MED ORDER — AZILSARTAN MEDOXOMIL 40 MG PO TABS: 90.0000 | ORAL_TABLET | Freq: Every day | ORAL | 1 refills | Status: DC

## 2017-09-21 DIAGNOSIS — G4733 Obstructive sleep apnea (adult) (pediatric): Secondary | ICD-10-CM | POA: Diagnosis not present

## 2017-10-20 DIAGNOSIS — Z3189 Encounter for other procreative management: Secondary | ICD-10-CM | POA: Diagnosis not present

## 2017-10-25 ENCOUNTER — Encounter: Payer: Self-pay | Admitting: Internal Medicine

## 2017-10-25 ENCOUNTER — Ambulatory Visit: Payer: 59 | Admitting: Internal Medicine

## 2017-10-27 ENCOUNTER — Ambulatory Visit: Payer: 59 | Admitting: Family

## 2017-10-27 ENCOUNTER — Encounter: Payer: Self-pay | Admitting: Family

## 2017-10-27 VITALS — BP 142/90 | HR 89 | Temp 98.1°F | Ht 66.0 in | Wt 271.0 lb

## 2017-10-27 DIAGNOSIS — J9801 Acute bronchospasm: Secondary | ICD-10-CM | POA: Diagnosis not present

## 2017-10-27 MED ORDER — FLUTICASONE FUROATE-VILANTEROL 100-25 MCG/INH IN AEPB: 1.0000 | INHALATION_SPRAY | Freq: Every day | RESPIRATORY_TRACT | 1 refills | Status: DC

## 2017-10-27 MED FILL — BREO ELLIPTA 100-25 MCG INH: 100-25 | 30 days supply | Qty: 60 | Fill #0

## 2017-10-27 NOTE — Patient Instructions (Signed)
Bronchospasm, Adult Bronchospasm is when airways in the lungs get smaller. When this happens, it can be hard to breathe. You may cough. You may also make a whistling sound when you breathe (wheeze). Follow these instructions at home: Medicines  Take over-the-counter and prescription medicines only as told by your doctor.  If you need to use an inhaler or nebulizer to take your medicine, ask your doctor how to use it.  If you were given a spacer, always use it with your inhaler. Lifestyle  Change your heating and air conditioning filter. Do this at least once a month.  Try not to use fireplaces and wood stoves.  Do not  smoke. Do not  allow smoking in your home.  Try not to use things that have a strong smell, like perfume.  Get rid of pests (such as roaches and mice) and their poop.  Remove any mold from your home.  Keep your house clean. Get rid of dust.  Use cleaning products that have no smell.  Replace carpet with wood, tile, or vinyl flooring.  Use allergy-proof pillows, mattress covers, and box spring covers.  Wash bed sheets and blankets every week. Use hot water. Dry them in a dryer.  Use blankets that are made of polyester or cotton.  Wash your hands often.  Keep pets out of your bedroom.  When you exercise, try not to breathe in cold air. General instructions  Have a plan for getting medical care. Know these things: ? When to call your doctor. ? When to call local emergency services (911 in the [place] ? Where to go in an emergency.  Stay up to date on your shots (immunizations).  When you have an episode: ? Stay calm. ? Relax. ? Breathe slowly. Contact a doctor if:  Your muscles ache.  Your chest hurts.  The color of the mucus you cough up (sputum) changes from clear or white to yellow, green, gray, or bloody.  The mucus you cough up gets thicker.  You have a fever. Get help right away if:  The whistling sound gets worse, even after you  take your medicines.  Your coughing gets worse.  You find it even harder to breathe.  Your chest hurts very much. Summary  Bronchospasm is when airways in the lungs get smaller.  When this happens, it can be hard to breathe. You may cough. You may also make a whistling sound when you breathe.  Stay away from things that cause you to have episodes. These include smoke or dust. This information is not intended to replace advice given to you by your health care provider. Make sure you discuss any questions you have with your health care provider. Document Released: 10/19/2008 Document Revised: 12/26/2015 Document Reviewed: 12/26/2015 Elsevier Interactive Patient Education  2017 [employer].

## 2017-10-27 NOTE — Progress Notes (Signed)
Adam Dunn is a 37 y.o. male with the following history as recorded in [employer]:  Patient Active Problem List   Diagnosis Date Noted  . Essential hypertension 04/14/2017  . Routine general medical examination at a health care facility 04/14/2017  . Migraine with aura and without status migrainosus, not intractable 11/13/2015  . BMI 40.0-44.9, adult ([place]) 11/13/2015  . Neuropathy 10/17/2015  . Reactive airway disease 10/17/2014  . OSA on CPAP 11/08/2009    Current Outpatient Medications  Medication Sig Dispense Refill  . albuterol (PROVENTIL HFA;VENTOLIN HFA) 108 (90 Base) MCG/ACT inhaler Inhale 2 puffs into the lungs every 6 (six) hours as needed for wheezing or shortness of breath. 1 Inhaler 0  . Azilsartan Medoxomil (EDARBI) 40 MG TABS Take 90 tablets by mouth daily. 90 tablet 1  . Cholecalciferol 2000 units TABS Take 1 tablet (2,000 Units total) by mouth daily. 90 tablet 1  . eletriptan (RELPAX) 40 MG tablet Take 1 tablet (40 mg total) by mouth as needed for migraine or headache. May repeat in 2 hours if headache persists or recurs. 15 tablet 2  . omeprazole (PRILOSEC) 20 MG capsule Take 1 capsule (20 mg total) by mouth daily as needed. 90 capsule 1  . ondansetron (ZOFRAN) 8 MG tablet Take 1 tablet (8 mg total) by mouth every 8 (eight) hours as needed for nausea or vomiting. 20 tablet 5  . fluticasone furoate-vilanterol (BREO ELLIPTA) 100-25 MCG/INH AEPB Inhale 1 puff into the lungs daily. 28 each 1   No current facility-administered medications for this visit.     Allergies: Sulfamethoxazole-trimethoprim  Past Medical History:  Diagnosis Date  . Migraines     Past Surgical History:  Procedure Laterality Date  . HERNIA REPAIR     infant    Family History  Problem Relation Age of Onset  . Hypertension Mother   . Migraines Mother   . Fibromyalgia Mother   . Arthritis Mother   . Healthy Father   . Healthy Brother   . Coronary artery disease Maternal Grandfather   .  Emphysema Maternal Grandfather   . Colon cancer Neg Hx   . Prostate cancer Neg Hx     Social History   Tobacco Use  . Smoking status: Never Smoker  . Smokeless tobacco: Never Used  . Tobacco comment: 11/13/15 very very occas cigar  Substance Use Topics  . Alcohol use: Yes    Comment: 11/13/15 1 a week    Subjective:  Cough x 1 month; took Z-pak approximately 10 days ago with no relief; history of sickness-induced asthma; no fever, no chest pain, no shortness of breath; no fever; has been having to use his albuterol more recently; notes that in the past has had to use BREO to get relief;     Objective:  Vitals:   10/27/17 1512  BP: (!) 142/90  Pulse: 89  Temp: 98.1 F (36.7 C)  TempSrc: Oral  SpO2: 99%  Weight: 271 lb (122.9 kg)  Height: 5' 6"  (1.676 m)    General: Well developed, well nourished, in no acute distress  Skin : Warm and dry.  Head: Normocephalic and atraumatic  Eyes: Sclera and conjunctiva clear; pupils round and reactive to light; extraocular movements intact  Ears: External normal; canals clear; tympanic membranes normal  Oropharynx: Pink, supple. No suspicious lesions  Neck: Supple without thyromegaly, adenopathy  Lungs: Respirations unlabored; clear to auscultation bilaterally without wheeze, rales, rhonchi  CVS exam: normal rate and regular rhythm.  Neurologic: Alert  and oriented; speech intact; face symmetrical; moves all extremities well; CNII-XII intact without focal deficit   Assessment:  1. Acute bronchospasm     Plan:  History of sickness induced asthma; do not feel more antibiotics needed at this time; will treat with BREO 200 qd x 14 days; He is given Rx for BREO as well- understands to use when he starts with illness to hopefully prevent symptoms; Increase fluids, rest and follow-up worse, no better.   No follow-ups on file.  No orders of the defined types were placed in this encounter.   Requested Prescriptions   Signed Prescriptions  Disp Refills  . fluticasone furoate-vilanterol (BREO ELLIPTA) 100-25 MCG/INH AEPB 28 each 1    Sig: Inhale 1 puff into the lungs daily.

## 2017-11-03 MED FILL — OMEPRAZOLE 20 MG CPDR: 20 | 90 days supply | Qty: 90 | Fill #1

## 2017-11-15 ENCOUNTER — Ambulatory Visit (INDEPENDENT_AMBULATORY_CARE_PROVIDER_SITE_OTHER): Payer: 59 | Admitting: Internal Medicine

## 2017-11-15 ENCOUNTER — Ambulatory Visit (INDEPENDENT_AMBULATORY_CARE_PROVIDER_SITE_OTHER)
Admission: RE | Admit: 2017-11-15 | Discharge: 2017-11-15 | Disposition: A | Discharge status: Home / Self Care | Payer: 59 | Source: Ambulatory Visit | Attending: Internal Medicine | Admitting: Internal Medicine

## 2017-11-15 ENCOUNTER — Other Ambulatory Visit: Payer: Self-pay | Admitting: Internal Medicine

## 2017-11-15 ENCOUNTER — Other Ambulatory Visit (INDEPENDENT_AMBULATORY_CARE_PROVIDER_SITE_OTHER): Payer: 59

## 2017-11-15 ENCOUNTER — Encounter: Payer: Self-pay | Admitting: Internal Medicine

## 2017-11-15 ENCOUNTER — Encounter

## 2017-11-15 VITALS — BP 144/88 | HR 72 | Temp 98.6°F | Resp 16 | Ht 66.0 in | Wt 280.0 lb

## 2017-11-15 DIAGNOSIS — I10 Essential (primary) hypertension: Secondary | ICD-10-CM

## 2017-11-15 DIAGNOSIS — M19072 Primary osteoarthritis, left ankle and foot: Secondary | ICD-10-CM

## 2017-11-15 DIAGNOSIS — M19071 Primary osteoarthritis, right ankle and foot: Secondary | ICD-10-CM | POA: Diagnosis not present

## 2017-11-15 DIAGNOSIS — Z8042 Family history of malignant neoplasm of prostate: Secondary | ICD-10-CM | POA: Diagnosis not present

## 2017-11-15 DIAGNOSIS — M25552 Pain in left hip: Secondary | ICD-10-CM | POA: Diagnosis not present

## 2017-11-15 DIAGNOSIS — M25551 Pain in right hip: Secondary | ICD-10-CM | POA: Insufficient documentation

## 2017-11-15 DIAGNOSIS — R945 Abnormal results of liver function studies: Secondary | ICD-10-CM

## 2017-11-15 DIAGNOSIS — M2550 Pain in unspecified joint: Secondary | ICD-10-CM | POA: Diagnosis not present

## 2017-11-15 DIAGNOSIS — R7989 Other specified abnormal findings of blood chemistry: Secondary | ICD-10-CM

## 2017-11-15 LAB — CBC WITH DIFFERENTIAL/PLATELET
BASOS PCT: 1.2 % (ref 0.0–3.0)
Basophils Absolute: 0.1 10*3/uL (ref 0.0–0.1)
EOS ABS: 0.3 10*3/uL (ref 0.0–0.7)
Eosinophils Relative: 4 % (ref 0.0–5.0)
HCT: 43.6 % (ref 39.0–52.0)
HEMOGLOBIN: 14.5 g/dL (ref 13.0–17.0)
LYMPHS ABS: 2.4 10*3/uL (ref 0.7–4.0)
Lymphocytes Relative: 28 % (ref 12.0–46.0)
MCHC: 33.2 g/dL (ref 30.0–36.0)
MCV: 89.8 fl (ref 78.0–100.0)
MONO ABS: 0.7 10*3/uL (ref 0.1–1.0)
Monocytes Relative: 8.3 % (ref 3.0–12.0)
NEUTROS ABS: 5.1 10*3/uL (ref 1.4–7.7)
NEUTROS PCT: 58.5 % (ref 43.0–77.0)
PLATELETS: 300 10*3/uL (ref 150.0–400.0)
RBC: 4.85 Mil/uL (ref 4.22–5.81)
RDW: 13.5 % (ref 11.5–15.5)
WBC: 8.7 10*3/uL (ref 4.0–10.5)

## 2017-11-15 LAB — COMPREHENSIVE METABOLIC PANEL
ALK PHOS: 58 U/L (ref 39–117)
ALT: 78 U/L — AB (ref 0–53)
AST: 42 U/L — AB (ref 0–37)
Albumin: 4.6 g/dL (ref 3.5–5.2)
BILIRUBIN TOTAL: 0.6 mg/dL (ref 0.2–1.2)
BUN: 17 mg/dL (ref 6–23)
CO2: 28 mEq/L (ref 19–32)
CREATININE: 0.8 mg/dL (ref 0.40–1.50)
Calcium: 9.5 mg/dL (ref 8.4–10.5)
Chloride: 103 mEq/L (ref 96–112)
GFR: 114.62 mL/min (ref >60.00)
GLUCOSE: 106 mg/dL — AB (ref 70–99)
Potassium: 4 mEq/L (ref 3.5–5.1)
Sodium: 140 mEq/L (ref 135–145)
Total Protein: 6.9 g/dL (ref 6.0–8.3)

## 2017-11-15 LAB — SEDIMENTATION RATE: Sed Rate: 7 mm/hr (ref 0–15)

## 2017-11-15 LAB — C-REACTIVE PROTEIN: CRP: 0.9 mg/dL (ref 0.5–20.0)

## 2017-11-15 LAB — PSA: PSA: 0.5 ng/mL (ref 0.10–4.00)

## 2017-11-15 MED ORDER — LOSARTAN POTASSIUM 100 MG PO TABS: 100.0000 mg | ORAL_TABLET | Freq: Every day | ORAL | 1 refills | Status: DC

## 2017-11-15 MED ORDER — DICLOFENAC 18 MG PO CAPS: 1.0000 | ORAL_CAPSULE | Freq: Three times a day (TID) | ORAL | 3 refills | Status: DC

## 2017-11-15 MED FILL — LOSARTAN POTASSIUM 100 MG T: 100 | 90 days supply | Qty: 90 | Fill #0

## 2017-11-15 NOTE — Progress Notes (Signed)
Subjective:  Patient ID: Adam Dunn, male    DOB: 09-25-1980  Age: 37 y.o. MRN: [id number]  CC: Hypertension and Osteoarthritis   HPI Adam Dunn presents for f/up - He complains of a several month history of arthralgias in his shoulders, hips, knees, and feet.  His mother has a history of RA and he wants to be screened for RA.  He wants to see a podiatrist about the pain in his feet.  He also tells me his father has recently been diagnosed with metastatic prostate cancer.  The patient tells me his father does not have a genetic lineage to prostate cancer.  Nonetheless, the patient wants to have a prostate specific antigen completed.  He is not currently taking an antihypertensive because he felt like Adam Dunn was too expensive.  He complains of weight gain but denies headache, blurred vision, CP, DOE, palpitations, edema, or fatigue.  Outpatient Medications Prior to Visit  Medication Sig Dispense Refill  . albuterol (PROVENTIL HFA;VENTOLIN HFA) 108 (90 Base) MCG/ACT inhaler Inhale 2 puffs into the lungs every 6 (six) hours as needed for wheezing or shortness of breath. 1 Inhaler 0  . Cholecalciferol 2000 units TABS Take 1 tablet (2,000 Units total) by mouth daily. 90 tablet 1  . eletriptan (RELPAX) 40 MG tablet Take 1 tablet (40 mg total) by mouth as needed for migraine or headache. May repeat in 2 hours if headache persists or recurs. 15 tablet 2  . fluticasone furoate-vilanterol (BREO ELLIPTA) 100-25 MCG/INH AEPB Inhale 1 puff into the lungs daily. 28 each 1  . omeprazole (PRILOSEC) 20 MG capsule Take 1 capsule (20 mg total) by mouth daily as needed. 90 capsule 1  . ondansetron (ZOFRAN) 8 MG tablet Take 1 tablet (8 mg total) by mouth every 8 (eight) hours as needed for nausea or vomiting. 20 tablet 5  . Azilsartan Medoxomil (EDARBI) 40 MG TABS Take 90 tablets by mouth daily. 90 tablet 1   No facility-administered medications prior to visit.     ROS Review of Systems    Constitutional: Positive for unexpected weight change (wt gain). Negative for appetite change, chills, diaphoresis and fatigue.  HENT: Negative.   Eyes: Negative for visual disturbance.  Respiratory: Negative for cough, chest tightness, shortness of breath and wheezing.   Cardiovascular: Negative for chest pain, palpitations and leg swelling.  Gastrointestinal: Negative for abdominal pain, constipation, diarrhea, nausea and vomiting.  Endocrine: Negative.   Genitourinary: Negative.  Negative for difficulty urinating.  Musculoskeletal: Positive for arthralgias. Negative for back pain and myalgias.  Skin: Negative.   Neurological: Negative.  Negative for dizziness, weakness, light-headedness and headaches.  Hematological: Negative for adenopathy. Does not bruise/bleed easily.  Psychiatric/Behavioral: Negative.     Objective:  BP (!) 144/88 (BP Location: Left Arm, Patient Position: Sitting, Cuff Size: Large)   Pulse 72   Temp 98.6 F (37 C) (Oral)   Resp 16   Ht 5' 6"  (1.676 m)   Wt 280 lb (127 kg)   SpO2 97%   BMI 45.19 kg/m   BP Readings from Last 3 Encounters:  11/15/17 (!) 144/88  10/27/17 (!) 142/90  06/08/17 136/80    Wt Readings from Last 3 Encounters:  11/15/17 280 lb (127 kg)  10/27/17 271 lb (122.9 kg)  06/08/17 255 lb (115.7 kg)    Physical Exam  Constitutional: He is oriented to person, place, and time. No distress.  HENT:  Mouth/Throat: Oropharynx is clear and moist. No oropharyngeal exudate.  Eyes:  Conjunctivae are normal. No scleral icterus.  Neck: Normal range of motion. Neck supple. No JVD present. No thyromegaly present.  Cardiovascular: Normal rate, regular rhythm and normal heart sounds. Exam reveals no gallop.  No murmur heard. Pulmonary/Chest: Effort normal and breath sounds normal. No respiratory distress. He has no wheezes. He has no rales.  Abdominal: Soft. Bowel sounds are normal. He exhibits no mass. There is no hepatosplenomegaly. There is no  tenderness.  Musculoskeletal: Normal range of motion. He exhibits no edema, tenderness or deformity.  DJD noted in his feet over the 1st MTP joints  Lymphadenopathy:    He has no cervical adenopathy.  Neurological: He is alert and oriented to person, place, and time.  Skin: Skin is warm and dry. No rash noted. He is not diaphoretic.  Psychiatric: He has a normal mood and affect. His behavior is normal. Judgment and thought content normal.  Vitals reviewed.   Lab Results  Component Value Date   WBC 8.7 11/15/2017   HGB 14.5 11/15/2017   HCT 43.6 11/15/2017   PLT 300.0 11/15/2017   GLUCOSE 106 (H) 11/15/2017   CHOL 177 04/14/2017   TRIG 112.0 04/14/2017   HDL 50.70 04/14/2017   LDLCALC 104 (H) 04/14/2017   ALT 78 (H) 11/15/2017   AST 42 (H) 11/15/2017   NA 140 11/15/2017   K 4.0 11/15/2017   CL 103 11/15/2017   CREATININE 0.80 11/15/2017   BUN 17 11/15/2017   CO2 28 11/15/2017   TSH 2.55 04/14/2017   PSA 0.50 11/15/2017   HGBA1C 5.3 11/13/2015    Dg Chest 2 View  Result Date: 03/18/2016 CLINICAL DATA:  Cough for 4 days EXAM: CHEST  2 VIEW COMPARISON:  Chest x-ray of 10/17/2014 FINDINGS: No pneumonia or effusion is seen. Mediastinal and hilar contours are unremarkable. There is mild peribronchial thickening which may indicate bronchitis. Mediastinal and hilar contours are unremarkable. The heart is within normal limits in size. No bony abnormality is seen. IMPRESSION: No pneumonia or effusion.  Question bronchitis. Electronically Signed   By: Adam Dunn M.D.   On: 03/18/2016 16:38    Assessment & Plan:   Adam Dunn was seen today for hypertension and osteoarthritis.  Diagnoses and all orders for this visit:  Essential hypertension- His blood pressure is not adequately well controlled.  Will try a less expensive ARB. -     CBC with Differential/Platelet; Future -     Comprehensive metabolic panel; Future -     losartan (COZAAR) 100 MG tablet; Take 1 tablet (100 mg total) by  mouth daily.  Primary osteoarthritis of both feet-his symptoms and exam are consistent with osteoarthritis.  I have asked him to try a low-dose anti-inflammatory. -     Diclofenac (ZORVOLEX) 18 MG CAPS; Take 1 capsule by mouth 3 (three) times daily with meals. -     Ambulatory referral to Podiatry  Arthralgia, unspecified joint- Based on his symptoms, exam, normal xrays and negative screening for inflammatory arthritis I think this is consistent with osteoarthritis.  I will also screen him for Lyme disease.  Will treat with diclofenac. -     Diclofenac (ZORVOLEX) 18 MG CAPS; Take 1 capsule by mouth 3 (three) times daily with meals. -     Sedimentation rate; Future -     Rheumatoid factor; Future -     Cyclic citrul peptide antibody, IgG; Future -     B. burgdorfi antibodies by WB; Future -     ANA; Future -  C-reactive protein; Future  Family history of prostate cancer in father-his PSA is normal. -     PSA; Future  Pain of both hip joints -     Cancel: DG HIPS BILAT W OR W/O PELVIS MIN 5 VIEWS; Future  Elevated LFTs -     US Abdomen Limited RUQ; Future   I have discontinued Boykin P. Weninger's Azilsartan Medoxomil. I am also having him start on Diclofenac and losartan. Additionally, I am having him maintain his Cholecalciferol, eletriptan, ondansetron, omeprazole, albuterol, and fluticasone furoate-vilanterol.  Meds ordered this encounter  Medications  . Diclofenac (ZORVOLEX) 18 MG CAPS    Sig: Take 1 capsule by mouth 3 (three) times daily with meals.    Dispense:  90 capsule    Refill:  3  . losartan (COZAAR) 100 MG tablet    Sig: Take 1 tablet (100 mg total) by mouth daily.    Dispense:  90 tablet    Refill:  1     Follow-up: Return in about 3 months (around 02/15/2018).  Scarlette Calico, MD

## 2017-11-15 NOTE — Patient Instructions (Signed)

## 2017-11-16 ENCOUNTER — Encounter: Payer: Self-pay | Admitting: Internal Medicine

## 2017-11-16 DIAGNOSIS — K7581 Nonalcoholic steatohepatitis (NASH): Secondary | ICD-10-CM | POA: Insufficient documentation

## 2017-11-17 ENCOUNTER — Encounter: Payer: Self-pay | Admitting: Internal Medicine

## 2017-11-17 LAB — B. BURGDORFI ANTIBODIES BY WB
B burgdorferi IgG Abs (IB): NEGATIVE
B burgdorferi IgM Abs (IB): NEGATIVE
LYME DISEASE 23 KD IGG: NONREACTIVE
LYME DISEASE 23 KD IGM: NONREACTIVE
LYME DISEASE 39 KD IGM: NONREACTIVE
LYME DISEASE 41 KD IGG: NONREACTIVE
LYME DISEASE 41 KD IGM: NONREACTIVE
LYME DISEASE 66 KD IGG: NONREACTIVE
Lyme Disease 18 kD IgG: NONREACTIVE
Lyme Disease 28 kD IgG: NONREACTIVE
Lyme Disease 30 kD IgG: NONREACTIVE
Lyme Disease 39 kD IgG: NONREACTIVE
Lyme Disease 45 kD IgG: NONREACTIVE
Lyme Disease 58 kD IgG: NONREACTIVE
Lyme Disease 93 kD IgG: NONREACTIVE

## 2017-11-17 LAB — CYCLIC CITRUL PEPTIDE ANTIBODY, IGG

## 2017-11-17 LAB — RHEUMATOID FACTOR: Rhuematoid fact SerPl-aCnc: <14 IU/mL (ref <14)

## 2017-11-17 LAB — ANA: ANA: NEGATIVE

## 2017-11-22 ENCOUNTER — Other Ambulatory Visit: Payer: Self-pay | Admitting: Internal Medicine

## 2017-11-22 ENCOUNTER — Ambulatory Visit
Admission: RE | Admit: 2017-11-22 | Discharge: 2017-11-22 | Disposition: A | Discharge status: Home / Self Care | Payer: 59 | Source: Ambulatory Visit | Attending: Internal Medicine | Admitting: Internal Medicine

## 2017-11-22 ENCOUNTER — Encounter: Payer: Self-pay | Admitting: Internal Medicine

## 2017-11-22 DIAGNOSIS — R7989 Other specified abnormal findings of blood chemistry: Secondary | ICD-10-CM

## 2017-11-22 DIAGNOSIS — K7689 Other specified diseases of liver: Secondary | ICD-10-CM | POA: Diagnosis not present

## 2017-11-22 DIAGNOSIS — R945 Abnormal results of liver function studies: Principal | ICD-10-CM

## 2017-11-22 DIAGNOSIS — K7581 Nonalcoholic steatohepatitis (NASH): Secondary | ICD-10-CM

## 2017-11-22 MED ORDER — PIOGLITAZONE HCL 15 MG PO TABS: 15.0000 mg | ORAL_TABLET | Freq: Every day | ORAL | 1 refills | Status: DC

## 2017-11-23 ENCOUNTER — Ambulatory Visit (INDEPENDENT_AMBULATORY_CARE_PROVIDER_SITE_OTHER): Payer: 59

## 2017-11-23 ENCOUNTER — Ambulatory Visit: Payer: 59 | Admitting: Podiatry

## 2017-11-23 ENCOUNTER — Other Ambulatory Visit: Payer: Self-pay | Admitting: Podiatry

## 2017-11-23 VITALS — BP 182/101 | HR 93

## 2017-11-23 DIAGNOSIS — M779 Enthesopathy, unspecified: Secondary | ICD-10-CM | POA: Diagnosis not present

## 2017-11-23 DIAGNOSIS — M722 Plantar fascial fibromatosis: Secondary | ICD-10-CM

## 2017-11-23 DIAGNOSIS — M79671 Pain in right foot: Secondary | ICD-10-CM

## 2017-11-23 DIAGNOSIS — M79672 Pain in left foot: Secondary | ICD-10-CM

## 2017-11-23 DIAGNOSIS — M21619 Bunion of unspecified foot: Secondary | ICD-10-CM | POA: Diagnosis not present

## 2017-11-23 NOTE — Patient Instructions (Signed)

## 2017-11-26 NOTE — Progress Notes (Signed)
Subjective:   Patient ID: Adam Dunn, male   DOB: 37 y.o.   MRN: [id number]   HPI 37 year old male presents the office today for concerns of chronic heel pain on the right side which is been ongoing for last 10 years.  And is gradually getting worse.  He also states he has bunions on both feet with the right side worse than the left.  He gets an aching sensation to the bunion site. He has pain in the heels and when he first gets up after being on his feet all day. His PCP did give him zorvolex and only had this for the past few days. He has no other concerns today.   Review of Systems  All other systems reviewed and are negative.  Past Medical History:  Diagnosis Date  . Migraines     Past Surgical History:  Procedure Laterality Date  . HERNIA REPAIR     infant     Current Outpatient Medications:  .  albuterol (PROVENTIL HFA;VENTOLIN HFA) 108 (90 Base) MCG/ACT inhaler, Inhale 2 puffs into the lungs every 6 (six) hours as needed for wheezing or shortness of breath., Disp: 1 Inhaler, Rfl: 0 .  Cholecalciferol 2000 units TABS, Take 1 tablet (2,000 Units total) by mouth daily., Disp: 90 tablet, Rfl: 1 .  Diclofenac (ZORVOLEX) 18 MG CAPS, Take 1 capsule by mouth 3 (three) times daily with meals., Disp: 90 capsule, Rfl: 3 .  eletriptan (RELPAX) 40 MG tablet, Take 1 tablet (40 mg total) by mouth as needed for migraine or headache. May repeat in 2 hours if headache persists or recurs., Disp: 15 tablet, Rfl: 2 .  fluticasone furoate-vilanterol (BREO ELLIPTA) 100-25 MCG/INH AEPB, Inhale 1 puff into the lungs daily., Disp: 28 each, Rfl: 1 .  losartan (COZAAR) 100 MG tablet, Take 1 tablet (100 mg total) by mouth daily., Disp: 90 tablet, Rfl: 1 .  omeprazole (PRILOSEC) 20 MG capsule, Take 1 capsule (20 mg total) by mouth daily as needed., Disp: 90 capsule, Rfl: 1 .  ondansetron (ZOFRAN) 8 MG tablet, Take 1 tablet (8 mg total) by mouth every 8 (eight) hours as needed for nausea or vomiting., Disp:  20 tablet, Rfl: 5 .  pioglitazone (ACTOS) 15 MG tablet, Take 1 tablet (15 mg total) by mouth daily., Disp: 90 tablet, Rfl: 1  Allergies  Allergen Reactions  . Sulfamethoxazole-Trimethoprim Other (See Comments)    REACTION: Pt does not remember, was an infant          Objective:  Physical Exam  General: AAO x3, NAD  Dermatological: Skin is warm, dry and supple bilateral. Nails x 10 are well manicured; remaining integument appears unremarkable at this time. There are no open sores, no preulcerative lesions, no rash or signs of infection present.  Vascular: Dorsalis Pedis artery and Posterior Tibial artery pedal pulses are 2/4 bilateral with immedate capillary fill time. Pedal hair growth present. No varicosities and no lower extremity edema present bilateral. There is no pain with calf compression, swelling, warmth, erythema.   Neruologic: Grossly intact via light touch bilateral. Protective threshold with Semmes Wienstein monofilament intact to all pedal sites bilateral.   Musculoskeletal: Tenderness to palpation along the plantar medial tubercle of the calcaneus at the insertion of plantar fascia on the right > left foot. There is no pain along the course of the plantar fascia within the arch of the foot. Plantar fascia appears to be intact. There is no pain with lateral compression of the calcaneus or  pain with vibratory sensation. There is no pain along the course or insertion of the achilles tendon. No other areas of tenderness to bilateral lower extremities. Bunion deformity is present bilaterally with mild discomfort. No crepitation with ROM. No first ray hypermobility. Muscular strength 5/5 in all groups tested bilateral.  Gait: Unassisted, Nonantalgic.       Assessment:   37 year old male with bilateral heel pain, plantar fasciitis with bunion deformity    Plan:  -Treatment options discussed including all alternatives, risks, and complications -Etiology of symptoms were  discussed -X-rays were obtained and reviewed with the patient.  Bunion deformities present.  There is no evidence of acute fracture or stress fracture. -He is on anti-inflammatories and will continue with this.  He wishes to hold off on a steroid injection.  Discussed stretching, icing exercises daily.  Check orthotic coverage.  Discussed shoe gear modifications as well.    Trula Slade DPM

## 2017-11-30 ENCOUNTER — Ambulatory Visit (INDEPENDENT_AMBULATORY_CARE_PROVIDER_SITE_OTHER): Payer: 59 | Admitting: Orthotics

## 2017-11-30 DIAGNOSIS — M722 Plantar fascial fibromatosis: Secondary | ICD-10-CM | POA: Diagnosis not present

## 2017-11-30 NOTE — Progress Notes (Signed)

## 2017-12-22 ENCOUNTER — Ambulatory Visit: Payer: 59 | Admitting: Orthotics

## 2017-12-22 DIAGNOSIS — M779 Enthesopathy, unspecified: Secondary | ICD-10-CM

## 2017-12-22 NOTE — Progress Notes (Signed)
Patient came in today to pick up custom made foot orthotics.  The goals were accomplished and the patient reported no dissatisfaction with said orthotics.  Patient was advised of breakin period and how to report any issues. 

## 2017-12-30 ENCOUNTER — Encounter: Payer: Self-pay | Admitting: Nurse Practitioner

## 2017-12-30 MED FILL — AZITHROMYCIN 250 MG TABLET: 250 | 5 days supply | Qty: 6 | Fill #0

## 2018-02-03 ENCOUNTER — Other Ambulatory Visit: Payer: Self-pay | Admitting: Internal Medicine

## 2018-02-03 MED FILL — OMEPRAZOLE 20 MG CPDR: 20 | 90 days supply | Qty: 90 | Fill #0

## 2018-02-23 ENCOUNTER — Encounter: Payer: Self-pay | Admitting: Family

## 2018-02-23 ENCOUNTER — Ambulatory Visit (INDEPENDENT_AMBULATORY_CARE_PROVIDER_SITE_OTHER): Payer: No Typology Code available for payment source | Admitting: Family

## 2018-02-23 VITALS — BP 148/90 | HR 80 | Temp 98.5°F | Ht 66.0 in | Wt 274.1 lb

## 2018-02-23 DIAGNOSIS — B9789 Other viral agents as the cause of diseases classified elsewhere: Secondary | ICD-10-CM | POA: Diagnosis not present

## 2018-02-23 DIAGNOSIS — R6889 Other general symptoms and signs: Secondary | ICD-10-CM | POA: Diagnosis not present

## 2018-02-23 DIAGNOSIS — J069 Acute upper respiratory infection, unspecified: Secondary | ICD-10-CM | POA: Diagnosis not present

## 2018-02-23 LAB — POC INFLUENZA A&B (BINAX/QUICKVUE)
INFLUENZA A, POC: NEGATIVE
Influenza B, POC: NEGATIVE

## 2018-02-23 MED ORDER — FLUTICASONE PROPIONATE 50 MCG/ACT NA SUSP: 2.0000 | Freq: Every day | NASAL | 6 refills | Status: DC

## 2018-02-23 MED FILL — FLUTICASONE PROP 50 MCG SPR: 50 | 30 days supply | Qty: 16 | Fill #0

## 2018-02-23 NOTE — Progress Notes (Signed)
Adam Dunn is a 38 y.o. male with the following history as recorded in Epic[facility]:  Patient Active Problem List   Diagnosis Date Noted  . Nonalcoholic steatohepatitis (NASH) 11/16/2017  . Primary osteoarthritis of both feet 11/15/2017  . Arthralgia 11/15/2017  . Family history of prostate cancer in father 11/15/2017  . Pain of both hip joints 11/15/2017  . Essential hypertension 04/14/2017  . Routine general medical examination at a health care facility 04/14/2017  . Migraine with aura and without status migrainosus, not intractable 11/13/2015  . BMI 40.0-44.9, adult ([place]) 11/13/2015  . Neuropathy 10/17/2015  . Reactive airway disease 10/17/2014  . OSA on CPAP 11/08/2009    Current Outpatient Medications  Medication Sig Dispense Refill  . albuterol (PROVENTIL HFA;VENTOLIN HFA) 108 (90 Base) MCG/ACT inhaler Inhale 2 puffs into the lungs every 6 (six) hours as needed for wheezing or shortness of breath. 1 Inhaler 0  . Cholecalciferol 2000 units TABS Take 1 tablet (2,000 Units total) by mouth daily. 90 tablet 1  . eletriptan (RELPAX) 40 MG tablet Take 1 tablet (40 mg total) by mouth as needed for migraine or headache. May repeat in 2 hours if headache persists or recurs. 15 tablet 2  . fluticasone furoate-vilanterol (BREO ELLIPTA) 100-25 MCG/INH AEPB Inhale 1 puff into the lungs daily. 28 each 1  . losartan (COZAAR) 100 MG tablet Take 1 tablet (100 mg total) by mouth daily. 90 tablet 1  . omeprazole (PRILOSEC) 20 MG capsule TAKE 1 CAPSULE BY MOUTH DAILY AS NEEDED. 90 capsule 1  . ondansetron (ZOFRAN) 8 MG tablet Take 1 tablet (8 mg total) by mouth every 8 (eight) hours as needed for nausea or vomiting. 20 tablet 5  . fluticasone (FLONASE) 50 MCG/ACT nasal spray Place 2 sprays into both nostrils daily. 16 g 6   No current facility-administered medications for this visit.     Allergies: Sulfamethoxazole-trimethoprim  Past Medical History:  Diagnosis Date  . Migraines     Past Surgical  History:  Procedure Laterality Date  . HERNIA REPAIR     infant    Family History  Problem Relation Age of Onset  . Hypertension Mother   . Migraines Mother   . Fibromyalgia Mother   . Arthritis Mother   . Healthy Father   . Healthy Brother   . Coronary artery disease Maternal Grandfather   . Emphysema Maternal Grandfather   . Colon cancer Neg Hx   . Prostate cancer Neg Hx     Social History   Tobacco Use  . Smoking status: Never Smoker  . Smokeless tobacco: Never Used  . Tobacco comment: 11/13/15 very very occas cigar  Substance Use Topics  . Alcohol use: Yes    Comment: 11/13/15 1 a week    Subjective:  2 day history of cold symptoms; is feeling better today but wanted to get flu test done today; notes he is feeling better today; no chest pain or shortness of breath; no OTC medications; has young children at home- no flu at home; prone to acute bronchospasm  Objective:  Vitals:   02/23/18 1116  BP: (!) 148/90  Pulse: 80  Temp: 98.5 F (36.9 C)  TempSrc: Oral  SpO2: 98%  Weight: 274 lb 1.3 oz (124.3 kg)  Height: 5' 6"  (1.676 m)    General: Well developed, well nourished, in no acute distress  Skin : Warm and dry.  Head: Normocephalic and atraumatic  Eyes: Sclera and conjunctiva clear; pupils round and reactive to  light; extraocular movements intact  Ears: External normal; canals clear; tympanic membranes normal  Oropharynx: Pink, supple. No suspicious lesions  Neck: Supple without thyromegaly, adenopathy  Lungs: Respirations unlabored; clear to auscultation bilaterally without wheeze, rales, rhonchi  CVS exam: normal rate and regular rhythm.  Neurologic: Alert and oriented; speech intact; face symmetrical; moves all extremities well; CNII-XII intact without focal deficit   Assessment:  1. Flu-like symptoms   2. Viral URI with cough     Plan:  Rapid flu test done at patient's request- negative; reassurance- symptomatic treatment discussed; he will go ahead and  start using BREO to hopefully limit bronchospasm; increase fluids, rest and follow-up worse, no better;    No follow-ups on file.  Orders Placed This Encounter  Procedures  . POC Influenza A&B (Binax test)    Requested Prescriptions   Signed Prescriptions Disp Refills  . fluticasone (FLONASE) 50 MCG/ACT nasal spray 16 g 6    Sig: Place 2 sprays into both nostrils daily.

## 2018-03-14 MED FILL — LOSARTAN POTASSIUM 100 MG T: 100 | 90 days supply | Qty: 90 | Fill #1

## 2018-03-14 MED FILL — BREO ELLIPTA 100-25 MCG INH: 100-25 | 30 days supply | Qty: 60 | Fill #1

## 2018-05-16 MED FILL — OMEPRAZOLE 20 MG CPDR: 20 | 90 days supply | Qty: 90 | Fill #0

## 2018-05-25 ENCOUNTER — Ambulatory Visit: Payer: 59 | Admitting: Nurse Practitioner

## 2018-07-01 ENCOUNTER — Other Ambulatory Visit: Payer: Self-pay | Admitting: Internal Medicine

## 2018-07-01 DIAGNOSIS — I10 Essential (primary) hypertension: Secondary | ICD-10-CM

## 2018-07-01 DIAGNOSIS — G43109 Migraine with aura, not intractable, without status migrainosus: Secondary | ICD-10-CM

## 2018-07-01 MED FILL — LOSARTAN POTASSIUM 100 MG T: 100 | 90 days supply | Qty: 90 | Fill #0

## 2018-07-04 ENCOUNTER — Other Ambulatory Visit: Payer: Self-pay | Admitting: Internal Medicine

## 2018-07-04 DIAGNOSIS — G43109 Migraine with aura, not intractable, without status migrainosus: Secondary | ICD-10-CM

## 2018-07-04 MED ORDER — RIZATRIPTAN BENZOATE 10 MG PO TABS: 10.0000 mg | ORAL_TABLET | ORAL | 2 refills | Status: DC | PRN

## 2018-07-04 MED FILL — RIZATRIPTAN BENZOATE 10 MG: 10 | 30 days supply | Qty: 9 | Fill #0

## 2018-07-05 ENCOUNTER — Encounter: Payer: Self-pay | Admitting: Internal Medicine

## 2018-07-06 ENCOUNTER — Telehealth: Payer: Self-pay

## 2018-07-06 NOTE — Telephone Encounter (Signed)
Key: [id number]   Clinical documentation has been sent for review.

## 2018-07-12 NOTE — Telephone Encounter (Signed)
PA was approved.   Pt informed via my chart message.

## 2018-07-25 ENCOUNTER — Other Ambulatory Visit: Payer: Self-pay | Admitting: *Deleted

## 2018-07-25 DIAGNOSIS — Z20822 Contact with and (suspected) exposure to covid-19: Secondary | ICD-10-CM

## 2018-07-25 MED FILL — ELETRIPTAN HYDROBROMIDE 40: 40 | 30 days supply | Qty: 12 | Fill #0

## 2018-07-25 NOTE — Addendum Note (Signed)
Addended by: Brigitte Pulse on: 07/25/2018 08:17 PM   Modules accepted: Orders

## 2018-07-27 LAB — NOVEL CORONAVIRUS, NAA: SARS-CoV-2, NAA: NOT DETECTED

## 2018-08-30 ENCOUNTER — Other Ambulatory Visit: Payer: Self-pay

## 2018-08-30 DIAGNOSIS — Z20822 Contact with and (suspected) exposure to covid-19: Secondary | ICD-10-CM

## 2018-08-31 LAB — NOVEL CORONAVIRUS, NAA: SARS-CoV-2, NAA: NOT DETECTED

## 2018-09-12 ENCOUNTER — Encounter: Payer: Self-pay | Admitting: Podiatry

## 2018-09-12 ENCOUNTER — Encounter: Payer: Self-pay | Admitting: Internal Medicine

## 2018-09-26 ENCOUNTER — Other Ambulatory Visit: Payer: Self-pay | Admitting: *Deleted

## 2018-09-26 DIAGNOSIS — Z20822 Contact with and (suspected) exposure to covid-19: Secondary | ICD-10-CM

## 2018-09-28 LAB — NOVEL CORONAVIRUS, NAA: SARS-CoV-2, NAA: NOT DETECTED

## 2018-10-03 ENCOUNTER — Other Ambulatory Visit: Payer: Self-pay

## 2018-10-03 DIAGNOSIS — Z20822 Contact with and (suspected) exposure to covid-19: Secondary | ICD-10-CM

## 2018-10-04 LAB — NOVEL CORONAVIRUS, NAA: SARS-CoV-2, NAA: NOT DETECTED

## 2018-10-07 ENCOUNTER — Other Ambulatory Visit: Payer: Self-pay

## 2018-10-07 DIAGNOSIS — Z20822 Contact with and (suspected) exposure to covid-19: Secondary | ICD-10-CM

## 2018-10-08 LAB — NOVEL CORONAVIRUS, NAA: SARS-CoV-2, NAA: NOT DETECTED

## 2018-10-11 ENCOUNTER — Other Ambulatory Visit: Payer: Self-pay

## 2018-10-11 DIAGNOSIS — Z20822 Contact with and (suspected) exposure to covid-19: Secondary | ICD-10-CM

## 2018-10-13 LAB — NOVEL CORONAVIRUS, NAA: SARS-CoV-2, NAA: NOT DETECTED

## 2018-10-14 ENCOUNTER — Other Ambulatory Visit: Payer: Self-pay | Admitting: Internal Medicine

## 2018-10-14 MED FILL — ELETRIPTAN HYDROBROMIDE 40: 40 | 30 days supply | Qty: 12 | Fill #1

## 2018-10-14 MED FILL — LOSARTAN POTASSIUM 100 MG T: 100 | 90 days supply | Qty: 90 | Fill #1

## 2018-10-17 MED FILL — OMEPRAZOLE 20 MG CAPSULE DR: 20 | 90 days supply | Qty: 90 | Fill #0

## 2018-10-30 ENCOUNTER — Encounter: Payer: Self-pay | Admitting: Internal Medicine

## 2018-10-31 ENCOUNTER — Other Ambulatory Visit: Payer: Self-pay | Admitting: Internal Medicine

## 2018-11-08 ENCOUNTER — Encounter: Payer: Self-pay | Admitting: Internal Medicine

## 2018-11-08 ENCOUNTER — Other Ambulatory Visit: Payer: Self-pay | Admitting: Internal Medicine

## 2019-01-02 ENCOUNTER — Ambulatory Visit: Payer: No Typology Code available for payment source | Attending: Internal Medicine

## 2019-01-02 ENCOUNTER — Other Ambulatory Visit: Payer: No Typology Code available for payment source

## 2019-01-02 DIAGNOSIS — U071 COVID-19: Secondary | ICD-10-CM

## 2019-01-03 ENCOUNTER — Encounter: Payer: Self-pay | Admitting: Internal Medicine

## 2019-01-03 LAB — NOVEL CORONAVIRUS, NAA: SARS-CoV-2, NAA: NOT DETECTED

## 2019-01-17 ENCOUNTER — Other Ambulatory Visit: Payer: Self-pay | Admitting: Internal Medicine

## 2019-01-17 DIAGNOSIS — I10 Essential (primary) hypertension: Secondary | ICD-10-CM

## 2019-01-17 MED FILL — OMEPRAZOLE 20 MG CAPSULE DR: 20 | 90 days supply | Qty: 90 | Fill #1

## 2019-01-17 MED FILL — ELETRIPTAN HYDROBROMIDE 40: 40 | 30 days supply | Qty: 12 | Fill #2

## 2019-01-18 ENCOUNTER — Encounter: Payer: Self-pay | Admitting: Internal Medicine

## 2019-01-20 ENCOUNTER — Other Ambulatory Visit: Payer: Self-pay | Admitting: Internal Medicine

## 2019-01-20 DIAGNOSIS — I10 Essential (primary) hypertension: Secondary | ICD-10-CM

## 2019-01-24 ENCOUNTER — Encounter: Payer: Self-pay | Admitting: Internal Medicine

## 2019-01-25 ENCOUNTER — Other Ambulatory Visit: Payer: Self-pay | Admitting: Internal Medicine

## 2019-01-25 ENCOUNTER — Other Ambulatory Visit: Payer: Self-pay

## 2019-01-25 ENCOUNTER — Encounter: Payer: Self-pay | Admitting: Internal Medicine

## 2019-01-25 ENCOUNTER — Ambulatory Visit (INDEPENDENT_AMBULATORY_CARE_PROVIDER_SITE_OTHER): Payer: No Typology Code available for payment source | Admitting: Internal Medicine

## 2019-01-25 VITALS — BP 118/84 | HR 89 | Temp 98.4°F | Ht 66.0 in | Wt 286.0 lb

## 2019-01-25 DIAGNOSIS — K219 Gastro-esophageal reflux disease without esophagitis: Secondary | ICD-10-CM

## 2019-01-25 DIAGNOSIS — G43109 Migraine with aura, not intractable, without status migrainosus: Secondary | ICD-10-CM

## 2019-01-25 DIAGNOSIS — F411 Generalized anxiety disorder: Secondary | ICD-10-CM

## 2019-01-25 DIAGNOSIS — Z6841 Body Mass Index (BMI) 40.0 and over, adult: Secondary | ICD-10-CM | POA: Diagnosis not present

## 2019-01-25 DIAGNOSIS — Z8042 Family history of malignant neoplasm of prostate: Secondary | ICD-10-CM

## 2019-01-25 DIAGNOSIS — Z23 Encounter for immunization: Secondary | ICD-10-CM

## 2019-01-25 DIAGNOSIS — K7581 Nonalcoholic steatohepatitis (NASH): Secondary | ICD-10-CM

## 2019-01-25 DIAGNOSIS — Z0000 Encounter for general adult medical examination without abnormal findings: Secondary | ICD-10-CM

## 2019-01-25 DIAGNOSIS — I10 Essential (primary) hypertension: Secondary | ICD-10-CM

## 2019-01-25 LAB — BASIC METABOLIC PANEL
BUN: 13 mg/dL (ref 6–23)
CO2: 29 mEq/L (ref 19–32)
Calcium: 9.5 mg/dL (ref 8.4–10.5)
Chloride: 102 mEq/L (ref 96–112)
Creatinine, Ser: 0.86 mg/dL (ref 0.40–1.50)
GFR: 98.6 mL/min (ref >60.00)
Glucose, Bld: 78 mg/dL (ref 70–99)
Potassium: 3.7 mEq/L (ref 3.5–5.1)
Sodium: 138 mEq/L (ref 135–145)

## 2019-01-25 LAB — HEPATIC FUNCTION PANEL
ALT: 61 U/L — ABNORMAL HIGH (ref 0–53)
AST: 27 U/L (ref 0–37)
Albumin: 4.5 g/dL (ref 3.5–5.2)
Alkaline Phosphatase: 56 U/L (ref 39–117)
Bilirubin, Direct: 0.1 mg/dL (ref 0.0–0.3)
Total Bilirubin: 0.6 mg/dL (ref 0.2–1.2)
Total Protein: 7.2 g/dL (ref 6.0–8.3)

## 2019-01-25 LAB — CBC WITH DIFFERENTIAL/PLATELET
Basophils Absolute: 0.1 10*3/uL (ref 0.0–0.1)
Basophils Relative: 0.9 % (ref 0.0–3.0)
Eosinophils Absolute: 0.4 10*3/uL (ref 0.0–0.7)
Eosinophils Relative: 3.9 % (ref 0.0–5.0)
HCT: 41.3 % (ref 39.0–52.0)
Hemoglobin: 13.6 g/dL (ref 13.0–17.0)
Lymphocytes Relative: 30 % (ref 12.0–46.0)
Lymphs Abs: 2.8 10*3/uL (ref 0.7–4.0)
MCHC: 33 g/dL (ref 30.0–36.0)
MCV: 89.3 fl (ref 78.0–100.0)
Monocytes Absolute: 0.8 10*3/uL (ref 0.1–1.0)
Monocytes Relative: 9 % (ref 3.0–12.0)
Neutro Abs: 5.3 10*3/uL (ref 1.4–7.7)
Neutrophils Relative %: 56.2 % (ref 43.0–77.0)
Platelets: 331 10*3/uL (ref 150.0–400.0)
RBC: 4.63 Mil/uL (ref 4.22–5.81)
RDW: 13.1 % (ref 11.5–15.5)
WBC: 9.4 10*3/uL (ref 4.0–10.5)

## 2019-01-25 LAB — LIPID PANEL
Cholesterol: 173 mg/dL (ref 0–200)
HDL: 41.9 mg/dL (ref >39.00)
LDL Cholesterol: 98 mg/dL (ref 0–99)
NonHDL: 131.03
Total CHOL/HDL Ratio: 4
Triglycerides: 167 mg/dL — ABNORMAL HIGH (ref 0.0–149.0)
VLDL: 33.4 mg/dL (ref 0.0–40.0)

## 2019-01-25 LAB — PROTIME-INR
INR: 1.1 ratio — ABNORMAL HIGH (ref 0.8–1.0)
Prothrombin Time: 13 s (ref 9.6–13.1)

## 2019-01-25 LAB — TSH: TSH: 4.26 u[IU]/mL (ref 0.35–4.50)

## 2019-01-25 LAB — HEMOGLOBIN A1C: Hgb A1c MFr Bld: 5.5 % (ref 4.6–6.5)

## 2019-01-25 LAB — VITAMIN D 25 HYDROXY (VIT D DEFICIENCY, FRACTURES): VITD: 32.14 ng/mL (ref 30.00–100.00)

## 2019-01-25 MED ORDER — ELETRIPTAN HYDROBROMIDE 40 MG PO TABS: ORAL_TABLET | ORAL | 2 refills | Status: DC

## 2019-01-25 MED ORDER — EDARBI 80 MG PO TABS: 1.0000 | ORAL_TABLET | Freq: Every day | ORAL | 0 refills | Status: DC

## 2019-01-25 MED ORDER — OMEPRAZOLE 20 MG PO CPDR: 20.0000 mg | DELAYED_RELEASE_CAPSULE | Freq: Every day | ORAL | 1 refills | Status: DC | PRN

## 2019-01-25 MED ORDER — PROPRANOLOL HCL ER 120 MG PO CP24: 120.0000 mg | ORAL_CAPSULE | Freq: Every day | ORAL | 1 refills | Status: DC

## 2019-01-25 MED FILL — PROPRANOLOL ER 120 MG CAP: 120 | 90 days supply | Qty: 90 | Fill #0

## 2019-01-25 NOTE — Patient Instructions (Signed)

## 2019-01-25 NOTE — Progress Notes (Signed)
Subjective:  Patient ID: Adam Dunn, male    DOB: July 31, 1980  Age: 39 y.o. MRN: [id number]  CC: Annual Exam and Hypertension  This visit occurred during the SARS-CoV-2 public health emergency.  Safety protocols were in place, including screening questions prior to the visit, additional usage of staff PPE, and extensive cleaning of exam room while observing appropriate contact time as indicated for disinfecting solutions.    HPI Adam Dunn presents for a CPX.  He thinks his blood pressure has been well controlled.  He is active and denies any recent episodes of chest pain, shortness of breath, headache, blurred vision, or edema.  He tells me he is compliant with losartan.  He has gained weight since I last saw him.  He has a history of NASH.  I previously recommended pioglitazone in addition to lifestyle modifications but his wife, who is a Software engineer, apparently warned him about side effects so he has not been taking it.  He denies abdominal pain, nausea, vomiting, icterus, loss of appetite, or weight loss.  He has a history of anxiety and migraines.  He has a migraine about every 2 to 3 weeks.  The migraines are adequately controlled with a triptan.  He says it previously he took propranolol to reduce the frequency and severity of migraines and to help control his anxiety.  He wants to start taking it again.  Outpatient Medications Prior to Visit  Medication Sig Dispense Refill  . albuterol (PROVENTIL HFA;VENTOLIN HFA) 108 (90 Base) MCG/ACT inhaler Inhale 2 puffs into the lungs every 6 (six) hours as needed for wheezing or shortness of breath. 1 Inhaler 0  . Cholecalciferol 2000 units TABS Take 1 tablet (2,000 Units total) by mouth daily. 90 tablet 1  . fluticasone (FLONASE) 50 MCG/ACT nasal spray Place 2 sprays into both nostrils daily. 16 g 6  . fluticasone furoate-vilanterol (BREO ELLIPTA) 100-25 MCG/INH AEPB Inhale 1 puff into the lungs daily. 28 each 1  . eletriptan (RELPAX) 40 MG  tablet TAKE 1 TABLET BY MOUTH AS NEEDED FOR MIGRAINE OR HEADACHE. MAY REPEAT IN 2 HOURS IF HEADACHE PERSISTS OR RECURS. 15 tablet 2  . losartan (COZAAR) 100 MG tablet TAKE 1 TABLET BY MOUTH DAILY. 90 tablet 1  . omeprazole (PRILOSEC) 20 MG capsule TAKE 1 CAPSULE BY MOUTH DAILY AS NEEDED. 90 capsule 1  . ondansetron (ZOFRAN) 8 MG tablet Take 1 tablet (8 mg total) by mouth every 8 (eight) hours as needed for nausea or vomiting. 20 tablet 5  . rizatriptan (MAXALT) 10 MG tablet Take 1 tablet (10 mg total) by mouth as needed for migraine. May repeat in 2 hours if needed 30 tablet 2   No facility-administered medications prior to visit.    ROS Review of Systems  Constitutional: Positive for unexpected weight change (wt gain). Negative for chills, diaphoresis and fatigue.  HENT: Negative.   Eyes: Negative for visual disturbance.  Respiratory: Negative for cough, chest tightness, shortness of breath and wheezing.   Cardiovascular: Negative for chest pain, palpitations and leg swelling.  Gastrointestinal: Negative for abdominal pain, constipation, diarrhea, nausea and vomiting.  Endocrine: Negative.   Genitourinary: Negative.  Negative for difficulty urinating, penile swelling, scrotal swelling and testicular pain.  Musculoskeletal: Negative.  Negative for arthralgias and myalgias.  Skin: Negative.  Negative for color change and pallor.  Neurological: Negative.  Negative for dizziness and light-headedness.  Hematological: Negative for adenopathy. Does not bruise/bleed easily.  Psychiatric/Behavioral: Negative for confusion, decreased concentration, dysphoric mood  and sleep disturbance. The patient is nervous/anxious.     Objective:  BP 118/84 (BP Location: Left Arm, Patient Position: Sitting, Cuff Size: Large)   Pulse 89   Temp 98.4 F (36.9 C) (Oral)   Ht 5' 6"  (1.676 m)   Wt 286 lb (129.7 kg)   SpO2 98%   BMI 46.16 kg/m   BP Readings from Last 3 Encounters:  01/25/19 118/84  02/23/18  (!) 148/90  11/23/17 (!) 182/101    Wt Readings from Last 3 Encounters:  01/25/19 286 lb (129.7 kg)  02/23/18 274 lb 1.3 oz (124.3 kg)  11/15/17 280 lb (127 kg)    Physical Exam Vitals reviewed.  Constitutional:      Appearance: He is obese.  HENT:     Nose: Nose normal.     Mouth/Throat:     Mouth: Mucous membranes are moist.  Eyes:     General: No scleral icterus.    Conjunctiva/sclera: Conjunctivae normal.  Cardiovascular:     Rate and Rhythm: Normal rate and regular rhythm.  Pulmonary:     Effort: Pulmonary effort is normal.     Breath sounds: No stridor. No wheezing, rhonchi or rales.  Abdominal:     General: Abdomen is protuberant. Bowel sounds are normal. There is no distension.     Palpations: Abdomen is soft. There is no hepatomegaly or splenomegaly.     Tenderness: There is no abdominal tenderness.  Musculoskeletal:        General: Normal range of motion.     Cervical back: Neck supple.  Lymphadenopathy:     Cervical: No cervical adenopathy.  Skin:    General: Skin is warm.     Findings: No lesion or rash.  Neurological:     General: No focal deficit present.     Mental Status: He is alert.  Psychiatric:        Mood and Affect: Mood normal.        Behavior: Behavior normal.     Lab Results  Component Value Date   WBC 9.4 01/25/2019   HGB 13.6 01/25/2019   HCT 41.3 01/25/2019   PLT 331.0 01/25/2019   GLUCOSE 78 01/25/2019   CHOL 173 01/25/2019   TRIG 167.0 (H) 01/25/2019   HDL 41.90 01/25/2019   LDLCALC 98 01/25/2019   ALT 61 (H) 01/25/2019   AST 27 01/25/2019   NA 138 01/25/2019   K 3.7 01/25/2019   CL 102 01/25/2019   CREATININE 0.86 01/25/2019   BUN 13 01/25/2019   CO2 29 01/25/2019   TSH 4.26 01/25/2019   PSA 0.6 01/25/2019   INR 1.1 (H) 01/25/2019   HGBA1C 5.5 01/25/2019    DG Foot Complete Left  Result Date: 11/23/2017 Please see detailed radiograph report in office note.  DG Foot Complete Right  Result Date:  11/23/2017 Please see detailed radiograph report in office note.  US Abdomen Limited RUQ  Result Date: 11/22/2017 CLINICAL DATA:  Elevated liver enzymes EXAM: ULTRASOUND ABDOMEN LIMITED RIGHT UPPER QUADRANT COMPARISON:  None. FINDINGS: Gallbladder: No gallstones or wall thickening visualized. There is no pericholecystic fluid. No sonographic Murphy sign noted by sonographer. Common bile duct: Diameter: 3 mm. No intrahepatic or extrahepatic biliary duct dilatation. Liver: No focal lesion identified. Liver echogenicity is increased diffusely. Portal vein is patent on color Doppler imaging with normal direction of blood flow towards the liver. IMPRESSION: Diffuse increase in liver echogenicity, a finding felt to be indicative of hepatic steatosis. While no focal liver lesions  are evident on this study, it must be cautioned that the sensitivity of ultrasound for detection of focal liver lesions is diminished in this circumstance. Study otherwise unremarkable. Electronically Signed   By: Lowella Grip III M.D.   On: 11/22/2017 11:46    Assessment & Plan:   Mataio was seen today for annual exam and hypertension.  Diagnoses and all orders for this visit:  Nonalcoholic steatohepatitis (NASH)- His liver enzymes remain mildly elevated.  To date, he is not willing to take pioglitazone.  I have asked him to continue working on his lifestyle modifications.  Screening for viral hepatitis is negative.  He has a protective antibody status against hep B.  I have asked him to get vaccinated against hepatitis A. -     Hepatic function panel -     Hepatitis B surface antibody,quantitative -     Hepatitis B surface antigen -     Hepatitis A antibody, total -     Hepatitis C antibody -     Hepatitis B core antibody, total -     Protime-INR  BMI 40.0-44.9, adult ([place])- He has lost some ground with this over the last year.  Labs are negative for secondary causes or complications.  He agrees to improve his  lifestyle modifications. -     Hemoglobin A1c  Essential hypertension- His blood pressure is not adequately well controlled.  I have asked him to upgrade to a more potent ARB and will restart a beta-blocker.  He also agrees to improve his lifestyle modifications. -     TSH -     Urinalysis, Routine w reflex microscopic -     CBC with Differential/Platelet -     Basic metabolic panel -     VITAMIN D 25 Hydroxy (Vit-D Deficiency, Fractures) -     propranolol ER (INDERAL LA) 120 MG 24 hr capsule; Take 1 capsule (120 mg total) by mouth daily. -     Azilsartan Medoxomil (EDARBI) 80 MG TABS; Take 1 tablet (80 mg total) by mouth daily.  Routine general medical examination at a health care facility- Exam completed, labs reviewed, vaccines reviewed and updated, patient education was given. -     Lipid panel -     HIV Antibody (routine testing w rflx)  Migraine with aura and without status migrainosus, not intractable- Will continue the triptan.  He will start taking propanolol to reduce the frequency and severity of the migraines. -     eletriptan (RELPAX) 40 MG tablet; TAKE 1 TABLET BY MOUTH AS NEEDED FOR MIGRAINE OR HEADACHE. MAY REPEAT IN 2 HOURS IF HEADACHE PERSISTS OR RECURS. -     propranolol ER (INDERAL LA) 120 MG 24 hr capsule; Take 1 capsule (120 mg total) by mouth daily.  Anxiety state -     propranolol ER (INDERAL LA) 120 MG 24 hr capsule; Take 1 capsule (120 mg total) by mouth daily.  Need for influenza vaccination -     Flu Vaccine QUAD 36+ mos IM  Gastroesophageal reflux disease without esophagitis-his symptoms are well controlled on the PPI.  Will continue. -     omeprazole (PRILOSEC) 20 MG capsule; Take 1 capsule (20 mg total) by mouth daily as needed.  Family history of prostate cancer in father- His PSA is low which is a reassuring sign that he does not have prostate cancer. -     PSA  Other orders -     TEST AUTHORIZATION -     PSA  I have discontinued Vikash P. Shiel's  ondansetron, losartan, and rizatriptan. I have also changed his omeprazole. Additionally, I am having him start on Edarbi. Lastly, I am having him maintain his Cholecalciferol, albuterol, fluticasone furoate-vilanterol, fluticasone, eletriptan, and propranolol ER.  Meds ordered this encounter  Medications  . eletriptan (RELPAX) 40 MG tablet    Sig: TAKE 1 TABLET BY MOUTH AS NEEDED FOR MIGRAINE OR HEADACHE. MAY REPEAT IN 2 HOURS IF HEADACHE PERSISTS OR RECURS.    Dispense:  15 tablet    Refill:  2  . omeprazole (PRILOSEC) 20 MG capsule    Sig: Take 1 capsule (20 mg total) by mouth daily as needed.    Dispense:  90 capsule    Refill:  1  . propranolol ER (INDERAL LA) 120 MG 24 hr capsule    Sig: Take 1 capsule (120 mg total) by mouth daily.    Dispense:  90 capsule    Refill:  1  . Azilsartan Medoxomil (EDARBI) 80 MG TABS    Sig: Take 1 tablet (80 mg total) by mouth daily.    Dispense:  105 tablet    Refill:  0     Follow-up: Return in about 3 months (around 04/25/2019).  Scarlette Calico, MD

## 2019-01-26 LAB — URINALYSIS, ROUTINE W REFLEX MICROSCOPIC
Bilirubin Urine: NEGATIVE
Hgb urine dipstick: NEGATIVE
Ketones, ur: NEGATIVE
Leukocytes,Ua: NEGATIVE
Nitrite: NEGATIVE
RBC / HPF: NONE SEEN (ref 0–?)
Specific Gravity, Urine: <=1.005 — AB (ref 1.000–1.030)
Total Protein, Urine: NEGATIVE
Urine Glucose: NEGATIVE
Urobilinogen, UA: 0.2 (ref 0.0–1.0)
WBC, UA: NONE SEEN (ref 0–?)
pH: 6.5 (ref 5.0–8.0)

## 2019-01-27 ENCOUNTER — Encounter: Payer: Self-pay | Admitting: Internal Medicine

## 2019-01-27 LAB — HEPATITIS A ANTIBODY, TOTAL: Hepatitis A AB,Total: NONREACTIVE

## 2019-01-27 LAB — HEPATITIS B SURFACE ANTIGEN: Hepatitis B Surface Ag: NONREACTIVE

## 2019-01-27 LAB — TEST AUTHORIZATION

## 2019-01-27 LAB — HEPATITIS B CORE ANTIBODY, TOTAL: Hep B Core Total Ab: NONREACTIVE

## 2019-01-27 LAB — HEPATITIS C ANTIBODY
Hepatitis C Ab: NONREACTIVE
SIGNAL TO CUT-OFF: 0.03 (ref <1.00)

## 2019-01-27 LAB — HEPATITIS B SURFACE ANTIBODY, QUANTITATIVE: Hepatitis B-Post: 67 m[IU]/mL (ref >=10)

## 2019-01-27 LAB — PSA: PSA: 0.6 ng/mL (ref <=4.0)

## 2019-01-27 LAB — HIV ANTIBODY (ROUTINE TESTING W REFLEX): HIV 1&2 Ab, 4th Generation: NONREACTIVE

## 2019-01-31 ENCOUNTER — Other Ambulatory Visit: Payer: No Typology Code available for payment source

## 2019-02-05 ENCOUNTER — Encounter: Payer: Self-pay | Admitting: Family

## 2019-02-06 ENCOUNTER — Other Ambulatory Visit: Payer: Self-pay | Admitting: Family

## 2019-02-06 ENCOUNTER — Ambulatory Visit: Payer: No Typology Code available for payment source | Attending: Internal Medicine

## 2019-02-06 DIAGNOSIS — Z20822 Contact with and (suspected) exposure to covid-19: Secondary | ICD-10-CM

## 2019-02-06 MED ORDER — BREO ELLIPTA 100-25 MCG/INH IN AEPB: 1.0000 | INHALATION_SPRAY | Freq: Every day | RESPIRATORY_TRACT | 0 refills | Status: DC

## 2019-02-06 MED FILL — BREO ELLIPTA 100-25 MCG INH: 100-25 | 30 days supply | Qty: 60 | Fill #0

## 2019-02-07 LAB — NOVEL CORONAVIRUS, NAA: SARS-CoV-2, NAA: NOT DETECTED

## 2019-02-13 ENCOUNTER — Encounter: Payer: Self-pay | Admitting: Internal Medicine

## 2019-02-14 ENCOUNTER — Other Ambulatory Visit: Payer: Self-pay | Admitting: Internal Medicine

## 2019-02-14 DIAGNOSIS — G43109 Migraine with aura, not intractable, without status migrainosus: Secondary | ICD-10-CM

## 2019-02-14 DIAGNOSIS — I10 Essential (primary) hypertension: Secondary | ICD-10-CM

## 2019-02-14 MED ORDER — PROPRANOLOL HCL ER 60 MG PO CP24: 60.0000 mg | ORAL_CAPSULE | Freq: Every day | ORAL | 1 refills | Status: DC

## 2019-02-14 MED FILL — PROPRANOLOL HCL ER 60 MG CP: 60 | 90 days supply | Qty: 90 | Fill #0

## 2019-02-20 ENCOUNTER — Other Ambulatory Visit: Payer: Self-pay | Admitting: Internal Medicine

## 2019-02-20 DIAGNOSIS — G43109 Migraine with aura, not intractable, without status migrainosus: Secondary | ICD-10-CM

## 2019-02-20 DIAGNOSIS — F411 Generalized anxiety disorder: Secondary | ICD-10-CM

## 2019-02-20 MED ORDER — PROPRANOLOL HCL 20 MG PO TABS: 20.0000 mg | ORAL_TABLET | Freq: Three times a day (TID) | ORAL | 1 refills | Status: DC

## 2019-02-20 MED FILL — PROPRANOLOL 20 MG TABLET: 20 | 90 days supply | Qty: 270 | Fill #0

## 2019-04-21 MED FILL — ELETRIPTAN HYDROBROMIDE 40: 40 | 30 days supply | Qty: 9 | Fill #3

## 2019-05-05 MED FILL — ELETRIPTAN HYDROBROMIDE 40: 40 | 30 days supply | Qty: 12 | Fill #0

## 2019-06-07 ENCOUNTER — Ambulatory Visit: Payer: No Typology Code available for payment source | Attending: Internal Medicine

## 2019-06-07 ENCOUNTER — Encounter: Payer: Self-pay | Admitting: Internal Medicine

## 2019-06-07 DIAGNOSIS — Z20822 Contact with and (suspected) exposure to covid-19: Secondary | ICD-10-CM

## 2019-06-08 LAB — SARS-COV-2, NAA 2 DAY TAT

## 2019-06-08 LAB — NOVEL CORONAVIRUS, NAA: SARS-CoV-2, NAA: NOT DETECTED

## 2019-06-12 ENCOUNTER — Ambulatory Visit (INDEPENDENT_AMBULATORY_CARE_PROVIDER_SITE_OTHER): Payer: No Typology Code available for payment source | Admitting: Internal Medicine

## 2019-06-12 ENCOUNTER — Encounter: Payer: Self-pay | Admitting: Internal Medicine

## 2019-06-12 ENCOUNTER — Other Ambulatory Visit: Payer: Self-pay

## 2019-06-12 ENCOUNTER — Other Ambulatory Visit: Payer: Self-pay | Admitting: Internal Medicine

## 2019-06-12 VITALS — BP 124/86 | HR 67 | Temp 98.0°F | Resp 16 | Ht 66.0 in | Wt 244.0 lb

## 2019-06-12 DIAGNOSIS — K7581 Nonalcoholic steatohepatitis (NASH): Secondary | ICD-10-CM

## 2019-06-12 DIAGNOSIS — Z23 Encounter for immunization: Secondary | ICD-10-CM

## 2019-06-12 DIAGNOSIS — I10 Essential (primary) hypertension: Secondary | ICD-10-CM | POA: Diagnosis not present

## 2019-06-12 LAB — HEPATIC FUNCTION PANEL
ALT: 19 U/L (ref 0–53)
AST: 15 U/L (ref 0–37)
Albumin: 4.6 g/dL (ref 3.5–5.2)
Alkaline Phosphatase: 52 U/L (ref 39–117)
Bilirubin, Direct: 0.2 mg/dL (ref 0.0–0.3)
Total Bilirubin: 0.7 mg/dL (ref 0.2–1.2)
Total Protein: 7 g/dL (ref 6.0–8.3)

## 2019-06-12 LAB — BASIC METABOLIC PANEL
BUN: 15 mg/dL (ref 6–23)
CO2: 31 mEq/L (ref 19–32)
Calcium: 9.4 mg/dL (ref 8.4–10.5)
Chloride: 103 mEq/L (ref 96–112)
Creatinine, Ser: 0.76 mg/dL (ref 0.40–1.50)
GFR: 113.5 mL/min (ref >60.00)
Glucose, Bld: 91 mg/dL (ref 70–99)
Potassium: 4.4 mEq/L (ref 3.5–5.1)
Sodium: 138 mEq/L (ref 135–145)

## 2019-06-12 MED ORDER — EDARBI 80 MG PO TABS: 1.0000 | ORAL_TABLET | Freq: Every day | ORAL | 1 refills | Status: DC

## 2019-06-12 NOTE — Progress Notes (Signed)
Subjective:  Patient ID: Adam Dunn, male    DOB: 07/12/1980  Age: 39 y.o. MRN: [id number]  CC: Hypertension  This visit occurred during the SARS-CoV-2 public health emergency.  Safety protocols were in place, including screening questions prior to the visit, additional usage of staff PPE, and extensive cleaning of exam room while observing appropriate contact time as indicated for disinfecting solutions.   HPI Adam Dunn presents for f/up - He tells me his blood pressure has been well controlled.  He has lost 42 pounds with lifestyle modifications.  He denies dizziness, lightheadedness, headache, blurred vision, chest pain, shortness of breath, palpitations, or edema.  Outpatient Medications Prior to Visit  Medication Sig Dispense Refill  . albuterol (PROVENTIL HFA;VENTOLIN HFA) 108 (90 Base) MCG/ACT inhaler Inhale 2 puffs into the lungs every 6 (six) hours as needed for wheezing or shortness of breath. 1 Inhaler 0  . Cholecalciferol 2000 units TABS Take 1 tablet (2,000 Units total) by mouth daily. 90 tablet 1  . eletriptan (RELPAX) 40 MG tablet TAKE 1 TABLET BY MOUTH AS NEEDED FOR MIGRAINE OR HEADACHE. MAY REPEAT IN 2 HOURS IF HEADACHE PERSISTS OR RECURS. 15 tablet 2  . fluticasone (FLONASE) 50 MCG/ACT nasal spray Place 2 sprays into both nostrils daily. 16 g 6  . fluticasone furoate-vilanterol (BREO ELLIPTA) 100-25 MCG/INH AEPB Inhale 1 puff into the lungs daily. 28 each 0  . omeprazole (PRILOSEC) 20 MG capsule Take 1 capsule (20 mg total) by mouth daily as needed. 90 capsule 1  . propranolol (INDERAL) 20 MG tablet Take 1 tablet (20 mg total) by mouth 3 (three) times daily. 270 tablet 1  . Azilsartan Medoxomil (EDARBI) 80 MG TABS Take 1 tablet (80 mg total) by mouth daily. 105 tablet 0   No facility-administered medications prior to visit.    ROS Review of Systems  Constitutional: Negative.  Negative for appetite change, diaphoresis, fatigue and unexpected weight change.  HENT:  Negative.   Eyes: Negative.   Respiratory: Negative for cough, chest tightness, shortness of breath and wheezing.   Cardiovascular: Negative for chest pain, palpitations and leg swelling.  Gastrointestinal: Negative for abdominal pain, constipation, diarrhea, nausea and vomiting.  Endocrine: Negative.   Genitourinary: Negative.  Negative for difficulty urinating and dysuria.  Musculoskeletal: Negative for arthralgias, back pain, myalgias and neck pain.  Skin: Negative for color change and pallor.  Neurological: Negative for dizziness, weakness, light-headedness and headaches.  Hematological: Negative for adenopathy. Does not bruise/bleed easily.  Psychiatric/Behavioral: Negative.     Objective:  BP 124/86 (BP Location: Left Arm, Patient Position: Sitting, Cuff Size: Large)   Pulse 67   Temp 98 F (36.7 C) (Oral)   Resp 16   Ht 5' 6"  (1.676 m)   Wt 244 lb (110.7 kg)   SpO2 99%   BMI 39.38 kg/m   BP Readings from Last 3 Encounters:  06/12/19 124/86  01/25/19 118/84  02/23/18 (!) 148/90    Wt Readings from Last 3 Encounters:  06/12/19 244 lb (110.7 kg)  01/25/19 286 lb (129.7 kg)  02/23/18 274 lb 1.3 oz (124.3 kg)    Physical Exam Vitals reviewed.  HENT:     Nose: Nose normal.     Mouth/Throat:     Mouth: Mucous membranes are moist.  Eyes:     General: No scleral icterus.    Conjunctiva/sclera: Conjunctivae normal.  Cardiovascular:     Rate and Rhythm: Normal rate and regular rhythm.     Heart sounds:  No murmur.  Pulmonary:     Effort: Pulmonary effort is normal.     Breath sounds: No stridor. No wheezing, rhonchi or rales.  Abdominal:     General: Abdomen is flat. Bowel sounds are normal. There is no distension.     Palpations: Abdomen is soft. There is no hepatomegaly, splenomegaly or mass.  Musculoskeletal:        General: Normal range of motion.     Cervical back: Neck supple.     Right lower leg: No edema.     Left lower leg: No edema.  Lymphadenopathy:       Cervical: No cervical adenopathy.  Skin:    General: Skin is warm and dry.     Coloration: Skin is not pale.  Neurological:     General: No focal deficit present.     Mental Status: He is alert.  Psychiatric:        Mood and Affect: Mood normal.        Behavior: Behavior normal.     Lab Results  Component Value Date   WBC 9.4 01/25/2019   HGB 13.6 01/25/2019   HCT 41.3 01/25/2019   PLT 331.0 01/25/2019   GLUCOSE 91 06/12/2019   CHOL 173 01/25/2019   TRIG 167.0 (H) 01/25/2019   HDL 41.90 01/25/2019   LDLCALC 98 01/25/2019   ALT 19 06/12/2019   AST 15 06/12/2019   NA 138 06/12/2019   K 4.4 06/12/2019   CL 103 06/12/2019   CREATININE 0.76 06/12/2019   BUN 15 06/12/2019   CO2 31 06/12/2019   TSH 4.26 01/25/2019   PSA 0.6 01/25/2019   INR 1.1 (H) 01/25/2019   HGBA1C 5.5 01/25/2019    DG Foot Complete Left  Result Date: 11/23/2017 Please see detailed radiograph report in office note.  DG Foot Complete Right  Result Date: 11/23/2017 Please see detailed radiograph report in office note.  US Abdomen Limited RUQ  Result Date: 11/22/2017 CLINICAL DATA:  Elevated liver enzymes EXAM: ULTRASOUND ABDOMEN LIMITED RIGHT UPPER QUADRANT COMPARISON:  None. FINDINGS: Gallbladder: No gallstones or wall thickening visualized. There is no pericholecystic fluid. No sonographic Murphy sign noted by sonographer. Common bile duct: Diameter: 3 mm. No intrahepatic or extrahepatic biliary duct dilatation. Liver: No focal lesion identified. Liver echogenicity is increased diffusely. Portal vein is patent on color Doppler imaging with normal direction of blood flow towards the liver. IMPRESSION: Diffuse increase in liver echogenicity, a finding felt to be indicative of hepatic steatosis. While no focal liver lesions are evident on this study, it must be cautioned that the sensitivity of ultrasound for detection of focal liver lesions is diminished in this circumstance. Study otherwise  unremarkable. Electronically Signed   By: Lowella Grip III M.D.   On: 11/22/2017 11:46    Assessment & Plan:   Adam Dunn was seen today for hypertension.  Diagnoses and all orders for this visit:  Nonalcoholic steatohepatitis (NASH)- His liver enzymes have normalized with lifestyle modifications. -     Hepatitis A vaccine adult IM -     Heplisav-B (HepB-CPG) Vaccine -     Hepatic function panel; Future -     Hepatic function panel  Essential hypertension- His blood pressure is well controlled.  Electrolytes and renal function are normal.  Will continue the current dose of the ARB. -     Basic metabolic panel; Future -     Azilsartan Medoxomil (EDARBI) 80 MG TABS; Take 1 tablet (80 mg total) by mouth daily. -  Basic metabolic panel  Need for hepatitis A immunization -     Hepatitis A vaccine adult IM  Need for hepatitis B vaccination -     Heplisav-B (HepB-CPG) Vaccine   I am having Adam Dunn maintain his Cholecalciferol, albuterol, fluticasone, eletriptan, omeprazole, Breo Ellipta, propranolol, and Edarbi.  Meds ordered this encounter  Medications  . Azilsartan Medoxomil (EDARBI) 80 MG TABS    Sig: Take 1 tablet (80 mg total) by mouth daily.    Dispense:  90 tablet    Refill:  1     Follow-up: Return in about 4 weeks (around 07/10/2019).  Scarlette Calico, MD

## 2019-06-12 NOTE — Patient Instructions (Signed)

## 2019-06-26 ENCOUNTER — Other Ambulatory Visit: Payer: Self-pay | Admitting: Internal Medicine

## 2019-06-26 DIAGNOSIS — G43109 Migraine with aura, not intractable, without status migrainosus: Secondary | ICD-10-CM

## 2019-06-26 MED FILL — ONDANSETRON HCL 8 MG TABLET: 8 | 7 days supply | Qty: 20 | Fill #0

## 2019-07-17 ENCOUNTER — Other Ambulatory Visit: Payer: Self-pay

## 2019-07-17 ENCOUNTER — Ambulatory Visit (INDEPENDENT_AMBULATORY_CARE_PROVIDER_SITE_OTHER): Payer: No Typology Code available for payment source | Admitting: *Deleted

## 2019-07-17 DIAGNOSIS — Z23 Encounter for immunization: Secondary | ICD-10-CM | POA: Diagnosis not present

## 2019-08-10 MED FILL — OMEPRAZOLE DR 20 MG CAPSULE: 20 | 90 days supply | Qty: 90 | Fill #0

## 2019-08-25 MED FILL — ELETRIPTAN HYDROBROMIDE 40: 40 | 30 days supply | Qty: 12 | Fill #0

## 2019-08-25 MED FILL — PROPRANOLOL 20 MG TABLET: 20 | 90 days supply | Qty: 270 | Fill #0

## 2019-08-25 MED FILL — OMEPRAZOLE 20 MG CAP: 20 | 90 days supply | Qty: 90 | Fill #0

## 2019-09-12 ENCOUNTER — Other Ambulatory Visit: Payer: Self-pay

## 2019-09-12 DIAGNOSIS — Z20822 Contact with and (suspected) exposure to covid-19: Secondary | ICD-10-CM

## 2019-09-13 LAB — NOVEL CORONAVIRUS, NAA: SARS-CoV-2, NAA: NOT DETECTED

## 2019-09-18 ENCOUNTER — Other Ambulatory Visit: Payer: Self-pay

## 2019-09-18 ENCOUNTER — Other Ambulatory Visit: Payer: No Typology Code available for payment source

## 2019-09-18 DIAGNOSIS — Z20822 Contact with and (suspected) exposure to covid-19: Secondary | ICD-10-CM

## 2019-09-19 ENCOUNTER — Other Ambulatory Visit: Payer: Self-pay

## 2019-09-19 LAB — SARS-COV-2, NAA 2 DAY TAT

## 2019-09-19 LAB — NOVEL CORONAVIRUS, NAA: SARS-CoV-2, NAA: NOT DETECTED

## 2019-09-22 ENCOUNTER — Other Ambulatory Visit: Payer: No Typology Code available for payment source

## 2019-12-06 ENCOUNTER — Other Ambulatory Visit: Payer: Self-pay | Admitting: Family

## 2019-12-06 ENCOUNTER — Other Ambulatory Visit: Payer: Self-pay | Admitting: Internal Medicine

## 2019-12-08 MED FILL — BREO ELLIPTA 100-25 MCG INH: 100-25 | 30 days supply | Qty: 60 | Fill #0

## 2019-12-11 ENCOUNTER — Other Ambulatory Visit: Payer: Self-pay

## 2019-12-11 ENCOUNTER — Ambulatory Visit: Payer: No Typology Code available for payment source

## 2019-12-14 ENCOUNTER — Ambulatory Visit (INDEPENDENT_AMBULATORY_CARE_PROVIDER_SITE_OTHER): Payer: No Typology Code available for payment source

## 2019-12-14 ENCOUNTER — Other Ambulatory Visit: Payer: Self-pay

## 2019-12-14 DIAGNOSIS — Z23 Encounter for immunization: Secondary | ICD-10-CM | POA: Diagnosis not present

## 2020-01-08 MED FILL — ELETRIPTAN HYDROBROMIDE 40: 40 | 30 days supply | Qty: 12 | Fill #1

## 2020-01-11 MED FILL — BREO ELLIPTA 100-25 MCG INH: 100-25 | 30 days supply | Qty: 60 | Fill #1

## 2020-01-11 MED FILL — EDARBI 80 MG TABLET: 80 | 90 days supply | Qty: 90 | Fill #0

## 2020-03-06 ENCOUNTER — Other Ambulatory Visit (HOSPITAL_COMMUNITY): Payer: Self-pay | Admitting: Child & Adolescent Psychiatry

## 2020-03-07 MED FILL — buPROPion HCL ER (XL) 150 M: 150 | 30 days supply | Qty: 30 | Fill #0

## 2020-03-28 ENCOUNTER — Other Ambulatory Visit: Payer: Self-pay | Admitting: Internal Medicine

## 2020-03-28 DIAGNOSIS — I10 Essential (primary) hypertension: Secondary | ICD-10-CM

## 2020-03-28 DIAGNOSIS — G43109 Migraine with aura, not intractable, without status migrainosus: Secondary | ICD-10-CM

## 2020-04-08 ENCOUNTER — Other Ambulatory Visit: Payer: Self-pay | Admitting: Internal Medicine

## 2020-04-08 ENCOUNTER — Other Ambulatory Visit (HOSPITAL_COMMUNITY): Payer: Self-pay

## 2020-04-08 MED ORDER — BREO ELLIPTA 100-25 MCG/INH IN AEPB
1.0000 | INHALATION_SPRAY | Freq: Every day | RESPIRATORY_TRACT | 0 refills | Status: DC
  Filled 2020-04-08: qty 60, 30d supply, fill #0
  Filled 2020-06-19: qty 60, 30d supply, fill #1

## 2020-04-10 ENCOUNTER — Other Ambulatory Visit (HOSPITAL_COMMUNITY): Payer: Self-pay

## 2020-04-10 MED ORDER — BUPROPION HCL ER (XL) 300 MG PO TB24
300.0000 mg | ORAL_TABLET | Freq: Every day | ORAL | 2 refills | Status: DC
  Filled 2020-04-10: qty 30, 30d supply, fill #0
  Filled 2020-05-08: qty 30, 30d supply, fill #1
  Filled 2020-06-09: qty 30, 30d supply, fill #2

## 2020-05-06 ENCOUNTER — Other Ambulatory Visit (HOSPITAL_COMMUNITY): Payer: Self-pay

## 2020-05-06 MED ORDER — CARESTART COVID-19 HOME TEST VI KIT
PACK | 0 refills | Status: DC
  Filled 2020-05-06: qty 4, 4d supply, fill #0

## 2020-05-08 ENCOUNTER — Other Ambulatory Visit (HOSPITAL_COMMUNITY): Payer: Self-pay

## 2020-05-10 ENCOUNTER — Other Ambulatory Visit: Payer: Self-pay | Admitting: Internal Medicine

## 2020-05-10 ENCOUNTER — Other Ambulatory Visit (HOSPITAL_COMMUNITY): Payer: Self-pay

## 2020-05-10 ENCOUNTER — Encounter: Payer: Self-pay | Admitting: Internal Medicine

## 2020-05-10 DIAGNOSIS — Z20828 Contact with and (suspected) exposure to other viral communicable diseases: Secondary | ICD-10-CM

## 2020-05-10 MED ORDER — OSELTAMIVIR PHOSPHATE 75 MG PO CAPS: 75.0000 mg | ORAL_CAPSULE | Freq: Every day | ORAL | 0 refills | Status: AC

## 2020-05-28 ENCOUNTER — Telehealth: Payer: Self-pay | Admitting: Podiatry

## 2020-05-28 NOTE — Telephone Encounter (Signed)
Pt called and left message stating Adam Dunn made him some orthotics 2.5 yrs ago and he thinks they maybe getting a little worn out. What does he need to do to get new ones. He said Adam Dunn did a great job on them.   I returned call and left message for pt that Adam Dunn is no longer here but to get a new pair of orthotics pt would need to see Dr Jacqualyn Posey and to call back to schedule an appt.

## 2020-06-09 ENCOUNTER — Other Ambulatory Visit: Payer: Self-pay | Admitting: Internal Medicine

## 2020-06-09 DIAGNOSIS — I10 Essential (primary) hypertension: Secondary | ICD-10-CM

## 2020-06-10 ENCOUNTER — Other Ambulatory Visit (HOSPITAL_COMMUNITY): Payer: Self-pay

## 2020-06-12 ENCOUNTER — Other Ambulatory Visit: Payer: Self-pay | Admitting: Internal Medicine

## 2020-06-12 DIAGNOSIS — I10 Essential (primary) hypertension: Secondary | ICD-10-CM

## 2020-06-14 ENCOUNTER — Encounter: Payer: Self-pay | Admitting: Internal Medicine

## 2020-06-14 ENCOUNTER — Other Ambulatory Visit (HOSPITAL_COMMUNITY): Payer: Self-pay

## 2020-06-17 ENCOUNTER — Other Ambulatory Visit: Payer: Self-pay

## 2020-06-17 ENCOUNTER — Ambulatory Visit (INDEPENDENT_AMBULATORY_CARE_PROVIDER_SITE_OTHER): Payer: No Typology Code available for payment source | Admitting: Internal Medicine

## 2020-06-17 ENCOUNTER — Encounter: Payer: Self-pay | Admitting: Internal Medicine

## 2020-06-17 ENCOUNTER — Other Ambulatory Visit (HOSPITAL_COMMUNITY): Payer: Self-pay

## 2020-06-17 VITALS — BP 134/82 | HR 82 | Temp 98.4°F | Resp 16 | Ht 66.0 in | Wt 280.0 lb

## 2020-06-17 DIAGNOSIS — Z0000 Encounter for general adult medical examination without abnormal findings: Secondary | ICD-10-CM | POA: Diagnosis not present

## 2020-06-17 DIAGNOSIS — I10 Essential (primary) hypertension: Secondary | ICD-10-CM

## 2020-06-17 DIAGNOSIS — K7581 Nonalcoholic steatohepatitis (NASH): Secondary | ICD-10-CM | POA: Diagnosis not present

## 2020-06-17 DIAGNOSIS — Z8042 Family history of malignant neoplasm of prostate: Secondary | ICD-10-CM | POA: Diagnosis not present

## 2020-06-17 LAB — BASIC METABOLIC PANEL
BUN: 15 mg/dL (ref 6–23)
CO2: 28 mEq/L (ref 19–32)
Calcium: 9.6 mg/dL (ref 8.4–10.5)
Chloride: 103 mEq/L (ref 96–112)
Creatinine, Ser: 0.92 mg/dL (ref 0.40–1.50)
GFR: 103.56 mL/min (ref >60.00)
Glucose, Bld: 94 mg/dL (ref 70–99)
Potassium: 4.9 mEq/L (ref 3.5–5.1)
Sodium: 139 mEq/L (ref 135–145)

## 2020-06-17 LAB — CBC WITH DIFFERENTIAL/PLATELET
Basophils Absolute: 0.1 10*3/uL (ref 0.0–0.1)
Basophils Relative: 0.7 % (ref 0.0–3.0)
Eosinophils Absolute: 0.3 10*3/uL (ref 0.0–0.7)
Eosinophils Relative: 3.4 % (ref 0.0–5.0)
HCT: 43.8 % (ref 39.0–52.0)
Hemoglobin: 14.5 g/dL (ref 13.0–17.0)
Lymphocytes Relative: 24.8 % (ref 12.0–46.0)
Lymphs Abs: 2.2 10*3/uL (ref 0.7–4.0)
MCHC: 33.2 g/dL (ref 30.0–36.0)
MCV: 88.3 fl (ref 78.0–100.0)
Monocytes Absolute: 0.7 10*3/uL (ref 0.1–1.0)
Monocytes Relative: 8.6 % (ref 3.0–12.0)
Neutro Abs: 5.4 10*3/uL (ref 1.4–7.7)
Neutrophils Relative %: 62.5 % (ref 43.0–77.0)
Platelets: 320 10*3/uL (ref 150.0–400.0)
RBC: 4.96 Mil/uL (ref 4.22–5.81)
RDW: 13.3 % (ref 11.5–15.5)
WBC: 8.7 10*3/uL (ref 4.0–10.5)

## 2020-06-17 LAB — HEPATIC FUNCTION PANEL
ALT: 40 U/L (ref 0–53)
AST: 20 U/L (ref 0–37)
Albumin: 4.5 g/dL (ref 3.5–5.2)
Alkaline Phosphatase: 56 U/L (ref 39–117)
Bilirubin, Direct: 0.1 mg/dL (ref 0.0–0.3)
Total Bilirubin: 0.6 mg/dL (ref 0.2–1.2)
Total Protein: 7.3 g/dL (ref 6.0–8.3)

## 2020-06-17 LAB — LIPID PANEL
Cholesterol: 167 mg/dL (ref 0–200)
HDL: 44.2 mg/dL (ref >39.00)
LDL Cholesterol: 100 mg/dL — ABNORMAL HIGH (ref 0–99)
NonHDL: 122.56
Total CHOL/HDL Ratio: 4
Triglycerides: 112 mg/dL (ref 0.0–149.0)
VLDL: 22.4 mg/dL (ref 0.0–40.0)

## 2020-06-17 LAB — PROTIME-INR
INR: 1 ratio (ref 0.8–1.0)
Prothrombin Time: 11.5 s (ref 9.6–13.1)

## 2020-06-17 LAB — PSA: PSA: 1.02 ng/mL (ref 0.10–4.00)

## 2020-06-17 MED ORDER — AZILSARTAN MEDOXOMIL 80 MG PO TABS
1.0000 | ORAL_TABLET | Freq: Every day | ORAL | 1 refills | Status: DC
  Filled 2020-06-17: qty 90, 90d supply, fill #0
  Filled 2020-12-04 – 2020-12-06 (×3): qty 90, 90d supply, fill #1

## 2020-06-17 NOTE — Patient Instructions (Signed)
Health Maintenance, Male Adopting a healthy lifestyle and getting preventive care are important in promoting health and wellness. Ask your health care provider about: The right schedule for you to have regular tests and exams. Things you can do on your own to prevent diseases and keep yourself healthy. What should I know about diet, weight, and exercise? Eat a healthy diet  Eat a diet that includes plenty of vegetables, fruits, low-fat dairy products, and lean protein. Do not eat a lot of foods that are high in solid fats, added sugars, or sodium.  Maintain a healthy weight Body mass index (BMI) is a measurement that can be used to identify possible weight problems. It estimates body fat based on height and weight. Your health care provider can help determine your BMI and help you achieve or maintain ahealthy weight. Get regular exercise Get regular exercise. This is one of the most important things you can do for your health. Most adults should: Exercise for at least 150 minutes each week. The exercise should increase your heart rate and make you sweat (moderate-intensity exercise). Do strengthening exercises at least twice a week. This is in addition to the moderate-intensity exercise. Spend less time sitting. Even light physical activity can be beneficial. Watch cholesterol and blood lipids Have your blood tested for lipids and cholesterol at 41 years of age, then havethis test every 5 years. You may need to have your cholesterol levels checked more often if: Your lipid or cholesterol levels are high. You are older than 40 years of age. You are at high risk for heart disease. What should I know about cancer screening? Many types of cancers can be detected early and may often be prevented. Depending on your health history and family history, you may need to have cancer screening at various ages. This may include screening for: Colorectal cancer. Prostate cancer. Skin cancer. Lung  cancer. What should I know about heart disease, diabetes, and high blood pressure? Blood pressure and heart disease High blood pressure causes heart disease and increases the risk of stroke. This is more likely to develop in people who have high blood pressure readings, are of African descent, or are overweight. Talk with your health care provider about your target blood pressure readings. Have your blood pressure checked: Every 3-5 years if you are 18-39 years of age. Every year if you are 40 years old or older. If you are between the ages of 65 and 75 and are a current or former smoker, ask your health care provider if you should have a one-time screening for abdominal aortic aneurysm (AAA). Diabetes Have regular diabetes screenings. This checks your fasting blood sugar level. Have the screening done: Once every three years after age 45 if you are at a normal weight and have a low risk for diabetes. More often and at a younger age if you are overweight or have a high risk for diabetes. What should I know about preventing infection? Hepatitis B If you have a higher risk for hepatitis B, you should be screened for this virus. Talk with your health care provider to find out if you are at risk forhepatitis B infection. Hepatitis C Blood testing is recommended for: Everyone born from 1945 through 1965. Anyone with known risk factors for hepatitis C. Sexually transmitted infections (STIs) You should be screened each year for STIs, including gonorrhea and chlamydia, if: You are sexually active and are younger than 41 years of age. You are older than 41 years of age   and your health care provider tells you that you are at risk for this type of infection. Your sexual activity has changed since you were last screened, and you are at increased risk for chlamydia or gonorrhea. Ask your health care provider if you are at risk. Ask your health care provider about whether you are at high risk for HIV.  Your health care provider may recommend a prescription medicine to help prevent HIV infection. If you choose to take medicine to prevent HIV, you should first get tested for HIV. You should then be tested every 3 months for as long as you are taking the medicine. Follow these instructions at home: Lifestyle Do not use any products that contain nicotine or tobacco, such as cigarettes, e-cigarettes, and chewing tobacco. If you need help quitting, ask your health care provider. Do not use street drugs. Do not share needles. Ask your health care provider for help if you need support or information about quitting drugs. Alcohol use Do not drink alcohol if your health care provider tells you not to drink. If you drink alcohol: Limit how much you have to 0-2 drinks a day. Be aware of how much alcohol is in your drink. In the [place] one drink equals one 12 oz bottle of beer (355 mL), one 5 oz glass of wine (148 mL), or one 1 oz glass of hard liquor (44 mL). General instructions Schedule regular health, dental, and eye exams. Stay current with your vaccines. Tell your health care provider if: You often feel depressed. You have ever been abused or do not feel safe at home. Summary Adopting a healthy lifestyle and getting preventive care are important in promoting health and wellness. Follow your health care provider's instructions about healthy diet, exercising, and getting tested or screened for diseases. Follow your health care provider's instructions on monitoring your cholesterol and blood pressure. This information is not intended to replace advice given to you by your health care provider. Make sure you discuss any questions you have with your healthcare provider. Document Revised: 12/15/2017 Document Reviewed: 12/15/2017 Elsevier Patient Education  2022 Elsevier Inc.  

## 2020-06-17 NOTE — Progress Notes (Signed)
Subjective:  Patient ID: Adam Dunn, male    DOB: July 03, 1980  Age: 40 y.o. MRN: [id number]  CC: Annual Exam and Hypertension  This visit occurred during the SARS-CoV-2 public health emergency.  Safety protocols were in place, including screening questions prior to the visit, additional usage of staff PPE, and extensive cleaning of exam room while observing appropriate contact time as indicated for disinfecting solutions.    HPI Adam Dunn presents for a CPX and f/up -   He tells me his blood pressure is adequately well controlled.  He is active and denies any recent episodes of chest pain, shortness of breath, dizziness, lightheadedness, diaphoresis, or headaches.  Outpatient Medications Prior to Visit  Medication Sig Dispense Refill   albuterol (PROVENTIL HFA;VENTOLIN HFA) 108 (90 Base) MCG/ACT inhaler Inhale 2 puffs into the lungs every 6 (six) hours as needed for wheezing or shortness of breath. 1 Inhaler 0   buPROPion (WELLBUTRIN XL) 300 MG 24 hr tablet Take 1 tablet(s) by mouth daily 30 tablet 2   Cholecalciferol 2000 units TABS Take 1 tablet (2,000 Units total) by mouth daily. 90 tablet 1   COVID-19 At Home Antigen Test (CARESTART COVID-19 HOME TEST) KIT use as directed 4 each 0   fluticasone (FLONASE) 50 MCG/ACT nasal spray Place 2 sprays into both nostrils daily. 16 g 6   fluticasone furoate-vilanterol (BREO ELLIPTA) 100-25 MCG/INH AEPB INHALE 1 PUFF INTO THE LUNGS DAILY. 120 each 0   omeprazole (PRILOSEC) 20 MG capsule Take 1 capsule (20 mg total) by mouth daily as needed. 90 capsule 1   ondansetron (ZOFRAN) 8 MG tablet TAKE 1 TABLET (8 MG TOTAL) BY MOUTH EVERY 8 HOURS AS NEEDED FOR NAUSEA OR VOMITING. 20 tablet 5   propranolol (INDERAL) 20 MG tablet Take 1 tablet (20 mg total) by mouth 3 (three) times daily. 270 tablet 1   eletriptan (RELPAX) 40 MG tablet TAKE 1 TABLET BY MOUTH AS NEEDED FOR MIGRAINE OR HEADACHE. MAY REPEAT IN 2 HOURS IF HEADACHE PERSISTS OR RECURS. 15 tablet  2   Azilsartan Medoxomil 80 MG TABS TAKE 1 TABLET BY MOUTH ONCE DAILY 90 tablet 0   No facility-administered medications prior to visit.    ROS Review of Systems  Constitutional:  Negative for chills, diaphoresis and fatigue.  HENT: Negative.    Eyes:  Negative for visual disturbance.  Respiratory:  Negative for chest tightness, shortness of breath and wheezing.   Cardiovascular:  Negative for chest pain, palpitations and leg swelling.  Gastrointestinal:  Negative for abdominal pain, diarrhea, nausea and vomiting.  Endocrine: Negative.   Genitourinary: Negative.  Negative for difficulty urinating, hematuria, scrotal swelling and testicular pain.  Musculoskeletal: Negative.   Skin: Negative.   Neurological: Negative.  Negative for dizziness, weakness and light-headedness.  Hematological:  Negative for adenopathy. Does not bruise/bleed easily.  Psychiatric/Behavioral: Negative.     Objective:  BP 134/82 (BP Location: Left Arm, Patient Position: Sitting, Cuff Size: Large)   Pulse 82   Temp 98.4 F (36.9 C) (Oral)   Resp 16   Ht 5' 6"  (1.676 m)   Wt 280 lb (127 kg)   SpO2 98%   BMI 45.19 kg/m   BP Readings from Last 3 Encounters:  06/17/20 134/82  06/12/19 124/86  01/25/19 118/84    Wt Readings from Last 3 Encounters:  06/17/20 280 lb (127 kg)  06/12/19 244 lb (110.7 kg)  01/25/19 286 lb (129.7 kg)    Physical Exam Vitals reviewed.  Constitutional:  Appearance: Normal appearance.  HENT:     Nose: Nose normal.     Mouth/Throat:     Mouth: Mucous membranes are moist.  Eyes:     Conjunctiva/sclera: Conjunctivae normal.  Cardiovascular:     Rate and Rhythm: Normal rate and regular rhythm.     Heart sounds: No murmur heard. Pulmonary:     Effort: Pulmonary effort is normal.     Breath sounds: No stridor. No wheezing, rhonchi or rales.  Abdominal:     General: Abdomen is protuberant. Bowel sounds are normal. There is no distension.     Palpations: Abdomen is  soft. There is no hepatomegaly, splenomegaly or mass.     Tenderness: There is no abdominal tenderness.  Musculoskeletal:        General: Normal range of motion.     Cervical back: Neck supple.     Right lower leg: No edema.     Left lower leg: No edema.  Lymphadenopathy:     Cervical: No cervical adenopathy.  Skin:    General: Skin is warm and dry.     Coloration: Skin is not pale.  Neurological:     General: No focal deficit present.     Mental Status: He is alert. Mental status is at baseline.  Psychiatric:        Mood and Affect: Mood normal.        Behavior: Behavior normal.    Lab Results  Component Value Date   WBC 8.7 06/17/2020   HGB 14.5 06/17/2020   HCT 43.8 06/17/2020   PLT 320.0 06/17/2020   GLUCOSE 94 06/17/2020   CHOL 167 06/17/2020   TRIG 112.0 06/17/2020   HDL 44.20 06/17/2020   LDLCALC 100 (H) 06/17/2020   ALT 40 06/17/2020   AST 20 06/17/2020   NA 139 06/17/2020   K 4.9 06/17/2020   CL 103 06/17/2020   CREATININE 0.92 06/17/2020   BUN 15 06/17/2020   CO2 28 06/17/2020   TSH 4.26 01/25/2019   PSA 1.02 06/17/2020   INR 1.0 06/17/2020   HGBA1C 5.5 01/25/2019    DG Foot Complete Left  Result Date: 11/23/2017 Please see detailed radiograph report in office note.  DG Foot Complete Right  Result Date: 11/23/2017 Please see detailed radiograph report in office note.  US Abdomen Limited RUQ  Result Date: 11/22/2017 CLINICAL DATA:  Elevated liver enzymes EXAM: ULTRASOUND ABDOMEN LIMITED RIGHT UPPER QUADRANT COMPARISON:  None. FINDINGS: Gallbladder: No gallstones or wall thickening visualized. There is no pericholecystic fluid. No sonographic Murphy sign noted by sonographer. Common bile duct: Diameter: 3 mm. No intrahepatic or extrahepatic biliary duct dilatation. Liver: No focal lesion identified. Liver echogenicity is increased diffusely. Portal vein is patent on color Doppler imaging with normal direction of blood flow towards the liver.  IMPRESSION: Diffuse increase in liver echogenicity, a finding felt to be indicative of hepatic steatosis. While no focal liver lesions are evident on this study, it must be cautioned that the sensitivity of ultrasound for detection of focal liver lesions is diminished in this circumstance. Study otherwise unremarkable. Electronically Signed   By: Lowella Grip III M.D.   On: 11/22/2017 11:46    Assessment & Plan:   Adam Dunn was seen today for annual exam and hypertension.  Diagnoses and all orders for this visit:  Nonalcoholic steatohepatitis (NASH)- His liver enzymes have improved.  He is working on his lifestyle modifications. -     Hepatic function panel; Future -  Protime-INR; Future -     Protime-INR -     Hepatic function panel  Essential hypertension- His blood pressure is adequately well controlled.  Will continue the current dose of the ARB. -     Azilsartan Medoxomil 80 MG TABS; Take 1 tablet (80 mg total) by mouth daily. -     Basic metabolic panel; Future -     CBC with Differential/Platelet; Future -     CBC with Differential/Platelet -     Basic metabolic panel  Routine general medical examination at a health care facility- Exam completed, labs reviewed, vaccines reviewed, cancer screenings are up-to-date, patient education was given. -     Lipid panel; Future -     PSA; Future -     PSA -     Lipid panel  Family history of prostate cancer in father -     PSA; Future -     PSA  I have changed Adam Dunn's Azilsartan Medoxomil. I am also having him maintain his Cholecalciferol, albuterol, fluticasone, omeprazole, propranolol, ondansetron, eletriptan, Breo Ellipta, buPROPion, and Carestart COVID-19 Home Test.  Meds ordered this encounter  Medications   Azilsartan Medoxomil 80 MG TABS    Sig: Take 1 tablet (80 mg total) by mouth daily.    Dispense:  90 tablet    Refill:  1     Follow-up: Return in about 6 months (around 12/17/2020).  Scarlette Calico, MD

## 2020-06-18 ENCOUNTER — Other Ambulatory Visit (HOSPITAL_COMMUNITY): Payer: Self-pay

## 2020-06-19 ENCOUNTER — Encounter: Payer: Self-pay | Admitting: Internal Medicine

## 2020-06-19 ENCOUNTER — Other Ambulatory Visit: Payer: Self-pay | Admitting: Internal Medicine

## 2020-06-19 ENCOUNTER — Other Ambulatory Visit (HOSPITAL_COMMUNITY): Payer: Self-pay

## 2020-06-19 DIAGNOSIS — G43109 Migraine with aura, not intractable, without status migrainosus: Secondary | ICD-10-CM

## 2020-06-19 MED ORDER — ELETRIPTAN HYDROBROMIDE 40 MG PO TABS
ORAL_TABLET | ORAL | 3 refills | Status: DC
  Filled 2020-06-19: qty 9, 30d supply, fill #0
  Filled 2020-06-25: qty 12, 30d supply, fill #0

## 2020-06-21 NOTE — Telephone Encounter (Signed)
PA has been initiated via telephone.

## 2020-06-25 ENCOUNTER — Other Ambulatory Visit (HOSPITAL_COMMUNITY): Payer: Self-pay

## 2020-06-26 ENCOUNTER — Other Ambulatory Visit: Payer: Self-pay | Admitting: Internal Medicine

## 2020-06-26 ENCOUNTER — Other Ambulatory Visit (HOSPITAL_COMMUNITY): Payer: Self-pay

## 2020-06-26 DIAGNOSIS — G43109 Migraine with aura, not intractable, without status migrainosus: Secondary | ICD-10-CM

## 2020-06-26 MED ORDER — RIZATRIPTAN BENZOATE 10 MG PO TABS
10.0000 mg | ORAL_TABLET | ORAL | 3 refills | Status: DC | PRN
  Filled 2020-06-26: qty 10, 30d supply, fill #0

## 2020-07-04 ENCOUNTER — Other Ambulatory Visit (HOSPITAL_COMMUNITY): Payer: Self-pay

## 2020-07-10 ENCOUNTER — Other Ambulatory Visit (HOSPITAL_COMMUNITY): Payer: Self-pay

## 2020-07-10 MED ORDER — BUPROPION HCL ER (XL) 300 MG PO TB24
300.0000 mg | ORAL_TABLET | Freq: Every day | ORAL | 0 refills | Status: DC
  Filled 2020-07-10 – 2020-07-18 (×2): qty 90, 90d supply, fill #0

## 2020-07-15 ENCOUNTER — Other Ambulatory Visit (HOSPITAL_COMMUNITY): Payer: Self-pay

## 2020-07-15 ENCOUNTER — Encounter: Payer: Self-pay | Admitting: Internal Medicine

## 2020-07-15 MED ORDER — CARESTART COVID-19 HOME TEST VI KIT
PACK | 0 refills | Status: DC
  Filled 2020-07-15: qty 4, 4d supply, fill #0

## 2020-07-16 ENCOUNTER — Other Ambulatory Visit: Payer: Self-pay | Admitting: Internal Medicine

## 2020-07-16 ENCOUNTER — Encounter: Payer: No Typology Code available for payment source | Admitting: Internal Medicine

## 2020-07-16 DIAGNOSIS — G43109 Migraine with aura, not intractable, without status migrainosus: Secondary | ICD-10-CM

## 2020-07-16 MED ORDER — ELETRIPTAN HYDROBROMIDE 20 MG PO TABS
20.0000 mg | ORAL_TABLET | ORAL | 5 refills | Status: DC | PRN
Start: 2020-07-16 — End: 2020-08-02

## 2020-07-17 ENCOUNTER — Telehealth: Payer: Self-pay

## 2020-07-17 ENCOUNTER — Other Ambulatory Visit (HOSPITAL_COMMUNITY): Payer: Self-pay

## 2020-07-17 NOTE — Telephone Encounter (Signed)
Key: [id number]

## 2020-07-18 ENCOUNTER — Other Ambulatory Visit (HOSPITAL_COMMUNITY): Payer: Self-pay

## 2020-07-22 NOTE — Telephone Encounter (Signed)
Per CoverMyMeds: ? ?PA was denied.  ?

## 2020-08-02 ENCOUNTER — Other Ambulatory Visit: Payer: Self-pay | Admitting: Internal Medicine

## 2020-08-02 DIAGNOSIS — G43109 Migraine with aura, not intractable, without status migrainosus: Secondary | ICD-10-CM

## 2020-08-02 MED ORDER — ELETRIPTAN HYDROBROMIDE 40 MG PO TABS: 40.0000 mg | ORAL_TABLET | ORAL | 3 refills | Status: DC | PRN

## 2020-09-23 ENCOUNTER — Other Ambulatory Visit (HOSPITAL_COMMUNITY): Payer: Self-pay

## 2020-09-23 MED ORDER — CARESTART COVID-19 HOME TEST VI KIT
PACK | 0 refills | Status: DC
  Filled 2020-09-23: qty 4, 4d supply, fill #0

## 2020-10-09 ENCOUNTER — Other Ambulatory Visit (HOSPITAL_COMMUNITY): Payer: Self-pay

## 2020-10-09 MED ORDER — BUPROPION HCL ER (XL) 300 MG PO TB24
300.0000 mg | ORAL_TABLET | Freq: Every day | ORAL | 0 refills | Status: DC
  Filled 2020-10-09 – 2021-10-04 (×2): qty 90, 90d supply, fill #0

## 2020-10-17 ENCOUNTER — Other Ambulatory Visit (HOSPITAL_COMMUNITY): Payer: Self-pay

## 2020-11-05 ENCOUNTER — Telehealth: Payer: No Typology Code available for payment source | Admitting: Physician Assistant

## 2020-11-05 DIAGNOSIS — U071 COVID-19: Secondary | ICD-10-CM

## 2020-11-05 MED ORDER — FLUTICASONE PROPIONATE 50 MCG/ACT NA SUSP
2.0000 | Freq: Every day | NASAL | 0 refills | Status: DC
Start: 2020-11-05 — End: 2023-04-01

## 2020-11-05 MED ORDER — BENZONATATE 100 MG PO CAPS: 100.0000 mg | ORAL_CAPSULE | Freq: Three times a day (TID) | ORAL | 0 refills | Status: DC | PRN

## 2020-11-05 MED ORDER — NIRMATRELVIR/RITONAVIR (PAXLOVID)TABLET: 3.0000 | ORAL_TABLET | Freq: Two times a day (BID) | ORAL | 0 refills | Status: AC

## 2020-11-05 NOTE — Progress Notes (Signed)
Virtual Visit Consent   Adam Dunn, you are scheduled for a virtual visit with a [facility] provider today.     Just as with appointments in the office, your consent must be obtained to participate.  Your consent will be active for this visit and any virtual visit you may have with one of our providers in the next 365 days.     If you have a MyChart account, a copy of this consent can be sent to you electronically.  All virtual visits are billed to your insurance company just like a traditional visit in the office.    As this is a virtual visit, video technology does not allow for your provider to perform a traditional examination.  This may limit your provider's ability to fully assess your condition.  If your provider identifies any concerns that need to be evaluated in person or the need to arrange testing (such as labs, EKG, etc.), we will make arrangements to do so.     Although advances in technology are sophisticated, we cannot ensure that it will always work on either your end or our end.  If the connection with a video visit is poor, the visit may have to be switched to a telephone visit.  With either a video or telephone visit, we are not always able to ensure that we have a secure connection.     I need to obtain your verbal consent now.   Are you willing to proceed with your visit today?    Adam Dunn has provided verbal consent on 11/05/2020 for a virtual visit (video or telephone).   Leeanne Rio, [place]   Date: 11/05/2020 6:55 PM   Virtual Visit via Video Note   I, Leeanne Rio, connected with  Adam Dunn  ([id number], Aug 10, 1980) on 11/05/20 at  6:45 PM EDT by a video-enabled telemedicine application and verified that I am speaking with the correct person using two identifiers.  Location: Patient: Virtual Visit Location Patient: Home Provider: Virtual Visit Location Provider: Home Office   I discussed the limitations of evaluation and management by  telemedicine and the availability of in person appointments. The patient expressed understanding and agreed to proceed.    History of Present Illness: Adam Dunn is a 40 y.o. who identifies as a male who was assigned male at birth, and is being seen today for COVID-19. Endorses symptoms started last night and worsened today with fever, fatigue, aches, chills, cough and chest congestion. Took a home COVID test this morning which was positive. [facility]  Some coughing with deep breaths. Denies SOB, chest pain. Denies nausea but did not an upset stomach yesterday. Denies recent travel. Denies known sick contact. Has been taking Alka-Seltzer plus and Advil for symptoms.   HPI: HPI  Problems:  Patient Active Problem List   Diagnosis Date Noted   Nonalcoholic steatohepatitis (NASH) 11/16/2017   Primary osteoarthritis of both feet 11/15/2017   Family history of prostate cancer in father 11/15/2017   Essential hypertension 04/14/2017   Routine general medical examination at a health care facility 04/14/2017   Migraine with aura and without status migrainosus, not intractable 11/13/2015   BMI 40.0-44.9, adult ([place]) 11/13/2015   Neuropathy 10/17/2015   Reactive airway disease 10/17/2014   OSA on CPAP 11/08/2009    Allergies:  Allergies  Allergen Reactions   Sulfamethoxazole-Trimethoprim Other (See Comments)    REACTION: Pt does not remember, was an infant  Medications:  Current Outpatient Medications:    benzonatate (TESSALON) 100 MG capsule, Take 1 capsule (100 mg total) by mouth 3 (three) times daily as needed for cough., Disp: 30 capsule, Rfl: 0   fluticasone (FLONASE) 50 MCG/ACT nasal spray, Place 2 sprays into both nostrils daily., Disp: 16 g, Rfl: 0   nirmatrelvir/ritonavir EUA (PAXLOVID) 20 x 150 MG & 10 x 100MG TABS, Take 3 tablets by mouth 2 (two) times daily for 5 days. (Take nirmatrelvir 150 mg two tablets twice daily for 5 days and ritonavir 100 mg one tablet  twice daily for 5 days) Patient GFR is > 90, Disp: 30 tablet, Rfl: 0   albuterol (PROVENTIL HFA;VENTOLIN HFA) 108 (90 Base) MCG/ACT inhaler, Inhale 2 puffs into the lungs every 6 (six) hours as needed for wheezing or shortness of breath., Disp: 1 Inhaler, Rfl: 0   Azilsartan Medoxomil 80 MG TABS, Take 1 tablet (80 mg total) by mouth daily., Disp: 90 tablet, Rfl: 1   buPROPion (WELLBUTRIN XL) 300 MG 24 hr tablet, Take 1 tablet (300 mg total) by mouth daily., Disp: 90 tablet, Rfl: 0   Cholecalciferol 2000 units TABS, Take 1 tablet (2,000 Units total) by mouth daily., Disp: 90 tablet, Rfl: 1   COVID-19 At Home Antigen Test (CARESTART COVID-19 HOME TEST) KIT, Use as directed, Disp: 4 each, Rfl: 0   eletriptan (RELPAX) 40 MG tablet, Take 1 tablet (40 mg total) by mouth as needed for migraine or headache. May repeat in 2 hours if headache persists or recurs., Disp: 10 tablet, Rfl: 3   fluticasone furoate-vilanterol (BREO ELLIPTA) 100-25 MCG/INH AEPB, INHALE 1 PUFF INTO THE LUNGS DAILY., Disp: 120 each, Rfl: 0   omeprazole (PRILOSEC) 20 MG capsule, Take 1 capsule (20 mg total) by mouth daily as needed., Disp: 90 capsule, Rfl: 1   ondansetron (ZOFRAN) 8 MG tablet, TAKE 1 TABLET (8 MG TOTAL) BY MOUTH EVERY 8 HOURS AS NEEDED FOR NAUSEA OR VOMITING., Disp: 20 tablet, Rfl: 5   propranolol (INDERAL) 20 MG tablet, Take 1 tablet (20 mg total) by mouth 3 (three) times daily., Disp: 270 tablet, Rfl: 1  Observations/Objective: Patient is well-developed, well-nourished in no acute distress.  Resting comfortably at home.  Head is normocephalic, atraumatic.  No labored breathing. Speech is clear and coherent with logical content.  Patient is alert and oriented at baseline.   Assessment and Plan: 1. COVID-19 - fluticasone (FLONASE) 50 MCG/ACT nasal spray; Place 2 sprays into both nostrils daily.  Dispense: 16 g; Refill: 0 - MyChart COVID-19 home monitoring program; Future - benzonatate (TESSALON) 100 MG capsule;  Take 1 capsule (100 mg total) by mouth 3 (three) times daily as needed for cough.  Dispense: 30 capsule; Refill: 0 - nirmatrelvir/ritonavir EUA (PAXLOVID) 20 x 150 MG & 10 x 100MG TABS; Take 3 tablets by mouth 2 (two) times daily for 5 days. (Take nirmatrelvir 150 mg two tablets twice daily for 5 days and ritonavir 100 mg one tablet twice daily for 5 days) Patient GFR is > 90  Dispense: 30 tablet; Refill: 0 Patient with multiple risk factors for complicated course of illness. Discussed risks/benefits of antiviral medications including most common potential ADRs. Patient voiced understanding and would like to proceed with antiviral medication. They are candidate for Paxlovid and Molnupiravir but are requesting Paxlovid. Discussed may decrease effect of Wellbutrin so to monitor mood. Rx sent to pharmacy. Supportive measures, OTC medications and vitamin regimen reviewed. Tessalon and Flonase per orders. Patient has been enrolled in  a MyChart COVID symptom monitoring program. Samule Dry reviewed in detail. Strict ER precautions discussed with patient.    Follow Up Instructions: I discussed the assessment and treatment plan with the patient. The patient was provided an opportunity to ask questions and all were answered. The patient agreed with the plan and demonstrated an understanding of the instructions.  A copy of instructions were sent to the patient via MyChart unless otherwise noted below.   The patient was advised to call back or seek an in-person evaluation if the symptoms worsen or if the condition fails to improve as anticipated.  Time:  I spent 12 minutes with the patient via telehealth technology discussing the above problems/concerns.    Leeanne Rio, PA-C

## 2020-11-05 NOTE — Patient Instructions (Signed)
Cully P Lavalley, thank you for joining Leeanne Rio, PA-C for today's virtual visit.  While this provider is not your primary care provider (PCP), if your PCP is located in our provider database this encounter information will be shared with them immediately following your visit.  Consent: (Patient) Adam Dunn provided verbal consent for this virtual visit at the beginning of the encounter.  Current Medications:  Current Outpatient Medications:    albuterol (PROVENTIL HFA;VENTOLIN HFA) 108 (90 Base) MCG/ACT inhaler, Inhale 2 puffs into the lungs every 6 (six) hours as needed for wheezing or shortness of breath., Disp: 1 Inhaler, Rfl: 0   Azilsartan Medoxomil 80 MG TABS, Take 1 tablet (80 mg total) by mouth daily., Disp: 90 tablet, Rfl: 1   buPROPion (WELLBUTRIN XL) 300 MG 24 hr tablet, Take 1 tablet (300 mg total) by mouth daily., Disp: 90 tablet, Rfl: 0   Cholecalciferol 2000 units TABS, Take 1 tablet (2,000 Units total) by mouth daily., Disp: 90 tablet, Rfl: 1   COVID-19 At Home Antigen Test (CARESTART COVID-19 HOME TEST) KIT, Use as directed, Disp: 4 each, Rfl: 0   eletriptan (RELPAX) 40 MG tablet, Take 1 tablet (40 mg total) by mouth as needed for migraine or headache. May repeat in 2 hours if headache persists or recurs., Disp: 10 tablet, Rfl: 3   fluticasone (FLONASE) 50 MCG/ACT nasal spray, Place 2 sprays into both nostrils daily., Disp: 16 g, Rfl: 6   fluticasone furoate-vilanterol (BREO ELLIPTA) 100-25 MCG/INH AEPB, INHALE 1 PUFF INTO THE LUNGS DAILY., Disp: 120 each, Rfl: 0   omeprazole (PRILOSEC) 20 MG capsule, Take 1 capsule (20 mg total) by mouth daily as needed., Disp: 90 capsule, Rfl: 1   ondansetron (ZOFRAN) 8 MG tablet, TAKE 1 TABLET (8 MG TOTAL) BY MOUTH EVERY 8 HOURS AS NEEDED FOR NAUSEA OR VOMITING., Disp: 20 tablet, Rfl: 5   propranolol (INDERAL) 20 MG tablet, Take 1 tablet (20 mg total) by mouth 3 (three) times daily., Disp: 270 tablet, Rfl: 1   Medications ordered  in this encounter:  No orders of the defined types were placed in this encounter.    *If you need refills on other medications prior to your next appointment, please contact your pharmacy*  Follow-Up: Call back or seek an in-person evaluation if the symptoms worsen or if the condition fails to improve as anticipated.  Other Instructions Please keep well-hydrated and get plenty of rest. Start a saline nasal rinse to flush out your nasal passages. You can use plain Mucinex to help thin congestion. If you have a humidifier, running in the bedroom at night. I want you to start OTC vitamin D3 1000 units daily, vitamin C 1000 mg daily, and a zinc supplement. Please take prescribed medications as directed.  Take the Paxlovid you requested as directed. This can potentially reduce the effectiveness of the Wellbutrin while you are taking so monitor for any decrease in mood. If significant, stop the Paxlovid and let us or your PCP know.   You have been enrolled in a MyChart symptom monitoring program. Please answer these questions daily so we can keep track of how you are doing.  You were to quarantine for 5 days from onset of your symptoms.  After day 5, if you have had no fever and you are feeling better, you can end quarantine but need to mask for an additional 5 days. After day 5 if you have a fever or are having significant symptoms, please quarantine for full  10 days.  If you note any worsening of symptoms, any significant shortness of breath or any chest pain, please seek ER evaluation ASAP.  Please do not delay care!  COVID-19: What to Do if You Are Sick If you test positive and are an older adult or someone who is at high risk of getting very sick from COVID-19, treatment may be available. Contact a healthcare provider right away after a positive test to determine if you are eligible, even if your symptoms are mild right now. You can also visit a Test to Treat location and, if eligible,  receive a prescription from a provider. Don't delay: Treatment must be started within the first few days to be effective. If you have a fever, cough, or other symptoms, you might have COVID-19. Most people have mild illness and are able to recover at home. If you are sick: Keep track of your symptoms. If you have an emergency warning sign (including trouble breathing), call 911. Steps to help prevent the spread of COVID-19 if you are sick If you are sick with COVID-19 or think you might have COVID-19, follow the steps below to care for yourself and to help protect other people in your home and community. Stay home except to get medical care Stay home. Most people with COVID-19 have mild illness and can recover at home without medical care. Do not leave your home, except to get medical care. Do not visit public areas and do not go to places where you are unable to wear a mask. Take care of yourself. Get rest and stay hydrated. Take over-the-counter medicines, such as acetaminophen, to help you feel better. Stay in touch with your doctor. Call before you get medical care. Be sure to get care if you have trouble breathing, or have any other emergency warning signs, or if you think it is an emergency. Avoid public transportation, ride-sharing, or taxis if possible. Get tested If you have symptoms of COVID-19, get tested. While waiting for test results, stay away from others, including staying apart from those living in your household. Get tested as soon as possible after your symptoms start. Treatments may be available for people with COVID-19 who are at risk for becoming very sick. Don't delay: Treatment must be started early to be effective--some treatments must begin within 5 days of your first symptoms. Contact your healthcare provider right away if your test result is positive to determine if you are eligible. Self-tests are one of several options for testing for the virus that causes COVID-19 and may  be more convenient than laboratory-based tests and point-of-care tests. Ask your healthcare provider or your local health department if you need help interpreting your test results. You can visit your state, tribal, local, and territorial health department's website to look for the latest local information on testing sites. Separate yourself from other people As much as possible, stay in a specific room and away from other people and pets in your home. If possible, you should use a separate bathroom. If you need to be around other people or animals in or outside of the home, wear a well-fitting mask. Tell your close contacts that they may have been exposed to COVID-19. An infected person can spread COVID-19 starting 48 hours (or 2 days) before the person has any symptoms or tests positive. By letting your close contacts know they may have been exposed to COVID-19, you are helping to protect everyone. See COVID-19 and Animals if you have questions about pets.  If you are diagnosed with COVID-19, someone from the health department may call you. Answer the call to slow the spread. Monitor your symptoms Symptoms of COVID-19 include fever, cough, or other symptoms. Follow care instructions from your healthcare provider and local health department. Your local health authorities may give instructions on checking your symptoms and reporting information. When to seek emergency medical attention Look for emergency warning signs* for COVID-19. If someone is showing any of these signs, seek emergency medical care immediately: Trouble breathing Persistent pain or pressure in the chest New confusion Inability to wake or stay awake Pale, gray, or blue-colored skin, lips, or nail beds, depending on skin tone *This list is not all possible symptoms. Please call your medical provider for any other symptoms that are severe or concerning to you. Call 911 or call ahead to your local emergency facility: Notify the  operator that you are seeking care for someone who has or may have COVID-19. Call ahead before visiting your doctor Call ahead. Many medical visits for routine care are being postponed or done by phone or telemedicine. If you have a medical appointment that cannot be postponed, call your doctor's office, and tell them you have or may have COVID-19. This will help the office protect themselves and other patients. If you are sick, wear a well-fitting mask You should wear a mask if you must be around other people or animals, including pets (even at home). Wear a mask with the best fit, protection, and comfort for you. You don't need to wear the mask if you are alone. If you can't put on a mask (because of trouble breathing, for example), cover your coughs and sneezes in some other way. Try to stay at least 6 feet away from other people. This will help protect the people around you. Masks should not be placed on young children under age 68 years, anyone who has trouble breathing, or anyone who is not able to remove the mask without help. Cover your coughs and sneezes Cover your mouth and nose with a tissue when you cough or sneeze. Throw away used tissues in a lined trash can. Immediately wash your hands with soap and water for at least 20 seconds. If soap and water are not available, clean your hands with an alcohol-based hand sanitizer that contains at least 60% alcohol. Clean your hands often Wash your hands often with soap and water for at least 20 seconds. This is especially important after blowing your nose, coughing, or sneezing; going to the bathroom; and before eating or preparing food. Use hand sanitizer if soap and water are not available. Use an alcohol-based hand sanitizer with at least 60% alcohol, covering all surfaces of your hands and rubbing them together until they feel dry. Soap and water are the best option, especially if hands are visibly dirty. Avoid touching your eyes, nose, and  mouth with unwashed hands. Handwashing Tips Avoid sharing personal household items Do not share dishes, drinking glasses, cups, eating utensils, towels, or bedding with other people in your home. Wash these items thoroughly after using them with soap and water or put in the dishwasher. Clean surfaces in your home regularly Clean and disinfect high-touch surfaces (for example, doorknobs, tables, handles, light switches, and countertops) in your "sick room" and bathroom. In shared spaces, you should clean and disinfect surfaces and items after each use by the person who is ill. If you are sick and cannot clean, a caregiver or other person should only clean and  disinfect the area around you (such as your bedroom and bathroom) on an as needed basis. Your caregiver/other person should wait as long as possible (at least several hours) and wear a mask before entering, cleaning, and disinfecting shared spaces that you use. Clean and disinfect areas that may have blood, stool, or body fluids on them. Use household cleaners and disinfectants. Clean visible dirty surfaces with household cleaners containing soap or detergent. Then, use a household disinfectant. Use a product from [employer] List N: Disinfectants for Coronavirus (ZWCHE-52). Be sure to follow the instructions on the label to ensure safe and effective use of the product. Many products recommend keeping the surface wet with a disinfectant for a certain period of time (look at "contact time" on the product label). You may also need to wear personal protective equipment, such as gloves, depending on the directions on the product label. Immediately after disinfecting, wash your hands with soap and water for 20 seconds. For completed guidance on cleaning and disinfecting your home, visit Complete Disinfection Guidance. Take steps to improve ventilation at home Improve ventilation (air flow) at home to help prevent from spreading COVID-19 to other people in  your household. Clear out COVID-19 virus particles in the air by opening windows, using air filters, and turning on fans in your home. Use this interactive tool to learn how to improve air flow in your home. When you can be around others after being sick with COVID-19 Deciding when you can be around others is different for different situations. Find out when you can safely end home isolation. For any additional questions about your care, contact your healthcare provider or state or local health department. 03/26/2020 Content source: [facility] ([facility]), Division of Viral Diseases This information is not intended to replace advice given to you by your health care provider. Make sure you discuss any questions you have with your health care provider. Document Revised: 05/09/2020 Document Reviewed: 05/09/2020 Elsevier Patient Education  2022 [employer].      If you have been instructed to have an in-person evaluation today at a local Urgent Care facility, please use the link below. It will take you to a list of all of our available [facility], including address, phone number and hours of operation. Please do not delay care.  [facility]  If you or a family member do not have a primary care provider, use the link below to schedule a visit and establish care. When you choose a [facility] primary care physician or advanced practice provider, you gain a long-term partner in health. Find a Primary Care Provider  Learn more about [facility]'s in-office and virtual care options: [facility]

## 2020-11-06 ENCOUNTER — Other Ambulatory Visit (HOSPITAL_COMMUNITY): Payer: Self-pay

## 2020-11-06 MED ORDER — CARESTART COVID-19 HOME TEST VI KIT
PACK | 0 refills | Status: DC
  Filled 2020-11-06: qty 2, 2d supply, fill #0

## 2020-11-14 ENCOUNTER — Telehealth: Payer: No Typology Code available for payment source | Admitting: Physician Assistant

## 2020-11-14 DIAGNOSIS — B9689 Other specified bacterial agents as the cause of diseases classified elsewhere: Secondary | ICD-10-CM | POA: Diagnosis not present

## 2020-11-14 DIAGNOSIS — J208 Acute bronchitis due to other specified organisms: Secondary | ICD-10-CM | POA: Diagnosis not present

## 2020-11-14 MED ORDER — AZITHROMYCIN 250 MG PO TABS
ORAL_TABLET | ORAL | 0 refills | Status: DC
Start: 2020-11-14 — End: 2020-12-05

## 2020-11-14 MED ORDER — PREDNISONE 10 MG (21) PO TBPK: ORAL_TABLET | ORAL | 0 refills | Status: DC

## 2020-11-14 NOTE — Progress Notes (Signed)
We are sorry that you are not feeling well.  Here is how we plan to help!  Based on your presentation I believe you most likely have A cough due to bacteria.  When patients have a fever and a productive cough with a change in color or increased sputum production, we are concerned about bacterial bronchitis.  If left untreated it can progress to pneumonia.  If your symptoms do not improve with your treatment plan it is important that you contact your provider.   I have prescribed Azithromyin 250 mg: two tablets now and then one tablet daily for 4 additonal days    In addition you may use   Prednisone 10 mg daily for 6 days (see taper instructions below)  Directions for 6 day taper: Day 1: 2 tablets before breakfast, 1 after both lunch & dinner and 2 at bedtime Day 2: 1 tab before breakfast, 1 after both lunch & dinner and 2 at bedtime Day 3: 1 tab at each meal & 1 at bedtime Day 4: 1 tab at breakfast, 1 at lunch, 1 at bedtime Day 5: 1 tab at breakfast & 1 tab at bedtime Day 6: 1 tab at breakfast  From your responses in the eVisit questionnaire you describe inflammation in the upper respiratory tract which is causing a significant cough.  This is commonly called Bronchitis and has four common causes:   Allergies Viral Infections Acid Reflux Bacterial Infection Allergies, viruses and acid reflux are treated by controlling symptoms or eliminating the cause. An example might be a cough caused by taking certain blood pressure medications. You stop the cough by changing the medication. Another example might be a cough caused by acid reflux. Controlling the reflux helps control the cough.  USE OF BRONCHODILATOR ("RESCUE") INHALERS: There is a risk from using your bronchodilator too frequently.  The risk is that over-reliance on a medication which only relaxes the muscles surrounding the breathing tubes can reduce the effectiveness of medications prescribed to reduce swelling and congestion of the  tubes themselves.  Although you feel brief relief from the bronchodilator inhaler, your asthma may actually be worsening with the tubes becoming more swollen and filled with mucus.  This can delay other crucial treatments, such as oral steroid medications. If you need to use a bronchodilator inhaler daily, several times per day, you should discuss this with your provider.  There are probably better treatments that could be used to keep your asthma under control.     HOME CARE Only take medications as instructed by your medical team. Complete the entire course of an antibiotic. Drink plenty of fluids and get plenty of rest. Avoid close contacts especially the very young and the elderly Cover your mouth if you cough or cough into your sleeve. Always remember to wash your hands A steam or ultrasonic humidifier can help congestion.   GET HELP RIGHT AWAY IF: You develop worsening fever. You become short of breath You cough up blood. Your symptoms persist after you have completed your treatment plan MAKE SURE YOU  Understand these instructions. Will watch your condition. Will get help right away if you are not doing well or get worse.    Thank you for choosing an e-visit.  Your e-visit answers were reviewed by a board certified advanced clinical practitioner to complete your personal care plan. Depending upon the condition, your plan could have included both over the counter or prescription medications.  Please review your pharmacy choice. Make sure the pharmacy is  open so you can pick up prescription now. If there is a problem, you may contact your provider through [employer] and have the prescription routed to another pharmacy.  Your safety is important to [place]. If you have drug allergies check your prescription carefully.   For the next 24 hours you can use MyChart to ask questions about today's visit, request a non-urgent call back, or ask for a work or school excuse. You will get an  email in the next two days asking about your experience. I hope that your e-visit has been valuable and will speed your recovery.  I provided 7 minutes of non face-to-face time during this encounter for chart review and documentation.

## 2020-11-21 ENCOUNTER — Encounter: Payer: Self-pay | Admitting: Internal Medicine

## 2020-11-25 ENCOUNTER — Ambulatory Visit: Payer: No Typology Code available for payment source | Admitting: Internal Medicine

## 2020-11-29 ENCOUNTER — Other Ambulatory Visit (HOSPITAL_COMMUNITY): Payer: Self-pay

## 2020-11-29 MED ORDER — CARESTART COVID-19 HOME TEST VI KIT
PACK | 0 refills | Status: DC
Start: 2020-11-29 — End: 2020-12-05
  Filled 2020-11-29: qty 4, 4d supply, fill #0

## 2020-12-04 ENCOUNTER — Other Ambulatory Visit (HOSPITAL_COMMUNITY): Payer: Self-pay

## 2020-12-05 ENCOUNTER — Encounter: Payer: Self-pay | Admitting: Internal Medicine

## 2020-12-05 ENCOUNTER — Ambulatory Visit (INDEPENDENT_AMBULATORY_CARE_PROVIDER_SITE_OTHER): Payer: No Typology Code available for payment source | Admitting: Internal Medicine

## 2020-12-05 ENCOUNTER — Other Ambulatory Visit: Payer: Self-pay

## 2020-12-05 ENCOUNTER — Ambulatory Visit (INDEPENDENT_AMBULATORY_CARE_PROVIDER_SITE_OTHER): Payer: No Typology Code available for payment source

## 2020-12-05 ENCOUNTER — Other Ambulatory Visit (HOSPITAL_COMMUNITY): Payer: Self-pay

## 2020-12-05 VITALS — BP 124/86 | HR 80 | Temp 98.2°F | Ht 66.0 in | Wt 284.0 lb

## 2020-12-05 DIAGNOSIS — M21611 Bunion of right foot: Secondary | ICD-10-CM | POA: Diagnosis not present

## 2020-12-05 DIAGNOSIS — I10 Essential (primary) hypertension: Secondary | ICD-10-CM | POA: Diagnosis not present

## 2020-12-05 DIAGNOSIS — J4521 Mild intermittent asthma with (acute) exacerbation: Secondary | ICD-10-CM | POA: Diagnosis not present

## 2020-12-05 DIAGNOSIS — L989 Disorder of the skin and subcutaneous tissue, unspecified: Secondary | ICD-10-CM | POA: Insufficient documentation

## 2020-12-05 DIAGNOSIS — R052 Subacute cough: Secondary | ICD-10-CM | POA: Insufficient documentation

## 2020-12-05 LAB — BASIC METABOLIC PANEL
BUN: 15 mg/dL (ref 6–23)
CO2: 32 mEq/L (ref 19–32)
Calcium: 9.8 mg/dL (ref 8.4–10.5)
Chloride: 106 mEq/L (ref 96–112)
Creatinine, Ser: 0.91 mg/dL (ref 0.40–1.50)
GFR: 104.58 mL/min (ref >60.00)
Glucose, Bld: 110 mg/dL — ABNORMAL HIGH (ref 70–99)
Potassium: 4.3 mEq/L (ref 3.5–5.1)
Sodium: 140 mEq/L (ref 135–145)

## 2020-12-05 LAB — CBC WITH DIFFERENTIAL/PLATELET
Basophils Absolute: 0.1 10*3/uL (ref 0.0–0.1)
Basophils Relative: 1.4 % (ref 0.0–3.0)
Eosinophils Absolute: 0.3 10*3/uL (ref 0.0–0.7)
Eosinophils Relative: 4.3 % (ref 0.0–5.0)
HCT: 42.1 % (ref 39.0–52.0)
Hemoglobin: 14 g/dL (ref 13.0–17.0)
Lymphocytes Relative: 33.2 % (ref 12.0–46.0)
Lymphs Abs: 2.2 10*3/uL (ref 0.7–4.0)
MCHC: 33.2 g/dL (ref 30.0–36.0)
MCV: 88.6 fl (ref 78.0–100.0)
Monocytes Absolute: 0.6 10*3/uL (ref 0.1–1.0)
Monocytes Relative: 9 % (ref 3.0–12.0)
Neutro Abs: 3.4 10*3/uL (ref 1.4–7.7)
Neutrophils Relative %: 52.1 % (ref 43.0–77.0)
Platelets: 276 10*3/uL (ref 150.0–400.0)
RBC: 4.75 Mil/uL (ref 4.22–5.81)
RDW: 12.8 % (ref 11.5–15.5)
WBC: 6.6 10*3/uL (ref 4.0–10.5)

## 2020-12-05 MED ORDER — HYDROCODONE BIT-HOMATROP MBR 5-1.5 MG/5ML PO SOLN
5.0000 mL | Freq: Four times a day (QID) | ORAL | 0 refills | Status: AC | PRN
Start: 2020-12-05 — End: 2020-12-12
  Filled 2020-12-05: qty 120, 6d supply, fill #0

## 2020-12-05 MED ORDER — BUDESONIDE-FORMOTEROL FUMARATE 80-4.5 MCG/ACT IN AERO
2.0000 | INHALATION_SPRAY | Freq: Two times a day (BID) | RESPIRATORY_TRACT | 1 refills | Status: DC
  Filled 2020-12-05: qty 30.6, 90d supply, fill #0
  Filled 2021-04-25: qty 30.6, 90d supply, fill #1

## 2020-12-05 NOTE — Progress Notes (Signed)
Subjective:  Patient ID: Adam Dunn, male    DOB: 02/23/1980  Age: 40 y.o. MRN: [id number]  CC: Hypertension, Cough, and Asthma  This visit occurred during the SARS-CoV-2 public health emergency.  Safety protocols were in place, including screening questions prior to the visit, additional usage of staff PPE, and extensive cleaning of exam room while observing appropriate contact time as indicated for disinfecting solutions.    HPI Adam Dunn presents for f/up -  He was diagnosed with COVID-19 infection about a month ago.  He was treated with Tessalon Perles, Zithromax, and a course of steroids.  In many ways he feels better but he continues to complain of nonproductive cough that interferes with his sleep and daily activities.  He is no longer using the LABA/ICS inhaler.  Outpatient Medications Prior to Visit  Medication Sig Dispense Refill   albuterol (PROVENTIL HFA;VENTOLIN HFA) 108 (90 Base) MCG/ACT inhaler Inhale 2 puffs into the lungs every 6 (six) hours as needed for wheezing or shortness of breath. 1 Inhaler 0   Azilsartan Medoxomil 80 MG TABS Take 1 tablet (80 mg total) by mouth daily. 90 tablet 1   buPROPion (WELLBUTRIN XL) 300 MG 24 hr tablet Take 1 tablet (300 mg total) by mouth daily. 90 tablet 0   Cholecalciferol 2000 units TABS Take 1 tablet (2,000 Units total) by mouth daily. 90 tablet 1   eletriptan (RELPAX) 40 MG tablet Take 1 tablet (40 mg total) by mouth as needed for migraine or headache. May repeat in 2 hours if headache persists or recurs. 10 tablet 3   fluticasone (FLONASE) 50 MCG/ACT nasal spray Place 2 sprays into both nostrils daily. 16 g 0   omeprazole (PRILOSEC) 20 MG capsule Take 1 capsule (20 mg total) by mouth daily as needed. 90 capsule 1   ondansetron (ZOFRAN) 8 MG tablet TAKE 1 TABLET (8 MG TOTAL) BY MOUTH EVERY 8 HOURS AS NEEDED FOR NAUSEA OR VOMITING. 20 tablet 5   predniSONE (STERAPRED UNI-PAK 21 TAB) 10 MG (21) TBPK tablet 6 day taper; take as  directed on package instructions 21 tablet 0   propranolol (INDERAL) 20 MG tablet Take 1 tablet (20 mg total) by mouth 3 (three) times daily. 270 tablet 1   azithromycin (ZITHROMAX) 250 MG tablet Take 2 tablets PO on day one, and one tablet PO daily thereafter until completed. 6 tablet 0   benzonatate (TESSALON) 100 MG capsule Take 1 capsule (100 mg total) by mouth 3 (three) times daily as needed for cough. 30 capsule 0   COVID-19 At Home Antigen Test (CARESTART COVID-19 HOME TEST) KIT use as directed 4 each 0   fluticasone furoate-vilanterol (BREO ELLIPTA) 100-25 MCG/INH AEPB INHALE 1 PUFF INTO THE LUNGS DAILY. 120 each 0   No facility-administered medications prior to visit.    ROS Review of Systems  Constitutional:  Positive for unexpected weight change (wt gain). Negative for chills, diaphoresis, fatigue and fever.  HENT:  Negative for postnasal drip, sinus pressure, sore throat and trouble swallowing.   Eyes: Negative.   Respiratory:  Positive for cough. Negative for chest tightness, shortness of breath and wheezing.   Cardiovascular:  Negative for chest pain, palpitations and leg swelling.  Gastrointestinal:  Negative for abdominal pain, constipation, diarrhea and vomiting.  Musculoskeletal:  Positive for arthralgias. Negative for myalgias and neck pain.       Pain in feet and numbness in right great toe  Skin: Negative.  Negative for color change and rash.       "  Skin tags"  Neurological:  Positive for numbness. Negative for dizziness and weakness.  Hematological:  Negative for adenopathy. Does not bruise/bleed easily.  Psychiatric/Behavioral: Negative.     Objective:  BP 124/86 (BP Location: Left Arm, Patient Position: Sitting, Cuff Size: Large)   Pulse 80   Temp 98.2 F (36.8 C) (Oral)   Ht 5' 6"  (1.676 m)   Wt 284 lb (128.8 kg)   SpO2 98%   BMI 45.84 kg/m   BP Readings from Last 3 Encounters:  12/05/20 124/86  06/17/20 134/82  06/12/19 124/86    Wt Readings from  Last 3 Encounters:  12/05/20 284 lb (128.8 kg)  06/17/20 280 lb (127 kg)  06/12/19 244 lb (110.7 kg)    Physical Exam Vitals reviewed.  Constitutional:      General: He is not in acute distress.    Appearance: He is obese. He is not ill-appearing, toxic-appearing or diaphoretic.  HENT:     Nose: Nose normal.     Mouth/Throat:     Mouth: Mucous membranes are moist.  Eyes:     General: No scleral icterus.    Conjunctiva/sclera: Conjunctivae normal.  Cardiovascular:     Rate and Rhythm: Normal rate and regular rhythm.     Pulses:          Dorsalis pedis pulses are 1+ on the right side and 1+ on the left side.       Posterior tibial pulses are 1+ on the right side and 1+ on the left side.     Heart sounds: No murmur heard. Pulmonary:     Effort: Pulmonary effort is normal.     Breath sounds: No stridor. No wheezing, rhonchi or rales.  Abdominal:     General: Abdomen is protuberant. Bowel sounds are normal. There is no distension.     Palpations: Abdomen is soft. There is no hepatomegaly, splenomegaly or mass.     Tenderness: There is no abdominal tenderness.  Musculoskeletal:        General: Normal range of motion.     Cervical back: Neck supple.     Right lower leg: No edema.     Left lower leg: No edema.     Right foot: Normal range of motion. Bunion present. No deformity.     Left foot: Normal range of motion. Bunion present. No deformity.  Lymphadenopathy:     Cervical: No cervical adenopathy.  Skin:    General: Skin is warm and dry.  Neurological:     General: No focal deficit present.     Mental Status: He is alert. Mental status is at baseline.  Psychiatric:        Mood and Affect: Mood normal.        Behavior: Behavior normal.    Lab Results  Component Value Date   WBC 8.7 06/17/2020   HGB 14.5 06/17/2020   HCT 43.8 06/17/2020   PLT 320.0 06/17/2020   GLUCOSE 94 06/17/2020   CHOL 167 06/17/2020   TRIG 112.0 06/17/2020   HDL 44.20 06/17/2020   LDLCALC  100 (H) 06/17/2020   ALT 40 06/17/2020   AST 20 06/17/2020   NA 139 06/17/2020   K 4.9 06/17/2020   CL 103 06/17/2020   CREATININE 0.92 06/17/2020   BUN 15 06/17/2020   CO2 28 06/17/2020   TSH 4.26 01/25/2019   PSA 1.02 06/17/2020   INR 1.0 06/17/2020   HGBA1C 5.5 01/25/2019    DG Foot Complete Left  Result  Date: 11/23/2017 Please see detailed radiograph report in office note.  DG Foot Complete Right  Result Date: 11/23/2017 Please see detailed radiograph report in office note.  US Abdomen Limited RUQ  Result Date: 11/22/2017 CLINICAL DATA:  Elevated liver enzymes EXAM: ULTRASOUND ABDOMEN LIMITED RIGHT UPPER QUADRANT COMPARISON:  None. FINDINGS: Gallbladder: No gallstones or wall thickening visualized. There is no pericholecystic fluid. No sonographic Murphy sign noted by sonographer. Common bile duct: Diameter: 3 mm. No intrahepatic or extrahepatic biliary duct dilatation. Liver: No focal lesion identified. Liver echogenicity is increased diffusely. Portal vein is patent on color Doppler imaging with normal direction of blood flow towards the liver. IMPRESSION: Diffuse increase in liver echogenicity, a finding felt to be indicative of hepatic steatosis. While no focal liver lesions are evident on this study, it must be cautioned that the sensitivity of ultrasound for detection of focal liver lesions is diminished in this circumstance. Study otherwise unremarkable. Electronically Signed   By: Lowella Grip III M.D.   On: 11/22/2017 11:46   DG Chest 2 View  Result Date: 12/06/2020 CLINICAL DATA:  Cough for 4 weeks. EXAM: CHEST - 2 VIEW COMPARISON:  X-ray chest 03/18/2016. FINDINGS: The heart size and mediastinal contours are within normal limits. Both lungs are clear. The visualized skeletal structures are unremarkable. IMPRESSION: No active cardiopulmonary disease. Electronically Signed   By: Kathreen Devoid M.D.   On: 12/06/2020 10:23     Assessment & Plan:   Ellard was seen today  for hypertension, cough and asthma.  Diagnoses and all orders for this visit:  Essential hypertension- His blood pressure is adequately well controlled. -     Basic metabolic panel; Future -     CBC with Differential/Platelet; Future -     CBC with Differential/Platelet -     Basic metabolic panel  Subacute cough- His chest x-ray is negative for mass or infiltrate.  Will treat for asthma and will offer a cough suppressant. -     CBC with Differential/Platelet; Future -     DG Chest 2 View; Future -     HYDROcodone bit-homatropine (HYCODAN) 5-1.5 MG/5ML syrup; Take 5 mLs by mouth every 6 (six) hours as needed for up to 7 days for cough. -     CBC with Differential/Platelet  Mild intermittent reactive airway disease with acute exacerbation -     budesonide-formoterol (SYMBICORT) 80-4.5 MCG/ACT inhaler; Inhale 2 puffs into the lungs 2 (two) times daily.  Bunion of great toe of right foot -     Ambulatory referral to Podiatry  Skin lesion -     Ambulatory referral to Dermatology  I have discontinued [employer]'s Breo Ellipta, benzonatate, azithromycin, and Carestart COVID-19 Home Test. I am also having him start on budesonide-formoterol and HYDROcodone bit-homatropine. Additionally, I am having him maintain his Cholecalciferol, albuterol, omeprazole, propranolol, ondansetron, Azilsartan Medoxomil, eletriptan, buPROPion, fluticasone, and predniSONE.  Meds ordered this encounter  Medications   budesonide-formoterol (SYMBICORT) 80-4.5 MCG/ACT inhaler    Sig: Inhale 2 puffs into the lungs 2 (two) times daily.    Dispense:  30.6 g    Refill:  1   HYDROcodone bit-homatropine (HYCODAN) 5-1.5 MG/5ML syrup    Sig: Take 5 mLs by mouth every 6 (six) hours as needed for up to 7 days for cough.    Dispense:  120 mL    Refill:  0      Follow-up: Return in about 6 weeks (around 01/16/2021).  Scarlette Calico, MD

## 2020-12-05 NOTE — Patient Instructions (Signed)

## 2020-12-06 ENCOUNTER — Other Ambulatory Visit (HOSPITAL_COMMUNITY): Payer: Self-pay

## 2020-12-06 ENCOUNTER — Encounter: Payer: Self-pay | Admitting: Internal Medicine

## 2020-12-07 ENCOUNTER — Other Ambulatory Visit: Payer: Self-pay | Admitting: Internal Medicine

## 2020-12-07 DIAGNOSIS — I10 Essential (primary) hypertension: Secondary | ICD-10-CM

## 2020-12-07 MED ORDER — IRBESARTAN 300 MG PO TABS
300.0000 mg | ORAL_TABLET | Freq: Every day | ORAL | 1 refills | Status: DC
  Filled 2020-12-07: qty 90, 90d supply, fill #0
  Filled 2021-03-21: qty 90, 90d supply, fill #1

## 2020-12-09 ENCOUNTER — Other Ambulatory Visit (HOSPITAL_COMMUNITY): Payer: Self-pay

## 2020-12-12 ENCOUNTER — Ambulatory Visit: Payer: No Typology Code available for payment source | Admitting: Internal Medicine

## 2020-12-23 ENCOUNTER — Ambulatory Visit: Payer: No Typology Code available for payment source | Admitting: Internal Medicine

## 2021-01-13 ENCOUNTER — Other Ambulatory Visit: Payer: Self-pay

## 2021-01-13 ENCOUNTER — Ambulatory Visit (INDEPENDENT_AMBULATORY_CARE_PROVIDER_SITE_OTHER): Payer: No Typology Code available for payment source | Admitting: Podiatry

## 2021-01-13 ENCOUNTER — Ambulatory Visit: Payer: No Typology Code available for payment source

## 2021-01-13 ENCOUNTER — Ambulatory Visit (INDEPENDENT_AMBULATORY_CARE_PROVIDER_SITE_OTHER): Payer: No Typology Code available for payment source

## 2021-01-13 DIAGNOSIS — M79671 Pain in right foot: Secondary | ICD-10-CM

## 2021-01-13 DIAGNOSIS — M79672 Pain in left foot: Secondary | ICD-10-CM

## 2021-01-13 DIAGNOSIS — M21619 Bunion of unspecified foot: Secondary | ICD-10-CM | POA: Diagnosis not present

## 2021-01-13 DIAGNOSIS — M722 Plantar fascial fibromatosis: Secondary | ICD-10-CM

## 2021-01-13 NOTE — Progress Notes (Signed)
DG

## 2021-01-14 NOTE — Progress Notes (Signed)
SITUATION Reason for Consult: Evaluation for Bilateral Custom Foot Orthoses Patient / Caregiver Report: Patient wants his feet to stop hurting  OBJECTIVE DATA: Patient History / Diagnosis:    ICD-10-CM   1. Bilateral foot pain  M79.671    M79.672       Current or Previous Devices: None and no history  Foot Examination: Skin presentation:   Intact Ulcers & Callousing:   None and no history Toe / Foot Deformities:  Pes cavus Weight Bearing Presentation:  Cavus Sensation:    Intact  ORTHOTIC RECOMMENDATION Recommended Device: 1x pair of custom functional insoles  GOALS OF ORTHOSES - Reduce Pain - Prevent Foot Deformity - Prevent Progression of Further Foot Deformity - Relieve Pressure - Improve the Overall Biomechanical Function of the Foot and Lower Extremity.  ACTIONS PERFORMED Patient was casted for Foot Orthoses via crush box. Procedure was explained and patient tolerated procedure well. All questions were answered and concerns addressed.  PLAN Potential out of pocket cost was communicated to patient. Casts are to be sent to [facility] for fabrication. Patient is to be called for fitting when devices are ready.

## 2021-01-15 ENCOUNTER — Other Ambulatory Visit (HOSPITAL_COMMUNITY): Payer: Self-pay

## 2021-01-15 MED ORDER — BUPROPION HCL ER (XL) 300 MG PO TB24
300.0000 mg | ORAL_TABLET | Freq: Every day | ORAL | 1 refills | Status: DC
  Filled 2021-01-15: qty 90, 90d supply, fill #0
  Filled 2021-05-21: qty 90, 90d supply, fill #1

## 2021-01-16 NOTE — Progress Notes (Signed)
Subjective:   Patient ID: Adam Dunn, male   DOB: 41 y.o.   MRN: [id number]   HPI 41 year old male presents the office today requesting new orthotics.  He has a history of plantar fasciitis and states that his inserts are worn out and started to get discomfort.  Also has been getting discomfort on the right big toe along the area of the bunion.  Does get some numbness to the area as well as some burning shooting pain.  No recent injury.  Pain is not localized.  No other concerns today.   Review of Systems  All other systems reviewed and are negative.  Past Medical History:  Diagnosis Date   Migraines     Past Surgical History:  Procedure Laterality Date   HERNIA REPAIR     infant     Current Outpatient Medications:    albuterol (PROVENTIL HFA;VENTOLIN HFA) 108 (90 Base) MCG/ACT inhaler, Inhale 2 puffs into the lungs every 6 (six) hours as needed for wheezing or shortness of breath., Disp: 1 Inhaler, Rfl: 0   budesonide-formoterol (SYMBICORT) 80-4.5 MCG/ACT inhaler, Inhale 2 puffs into the lungs 2 (two) times daily., Disp: 30.6 g, Rfl: 1   buPROPion (WELLBUTRIN XL) 300 MG 24 hr tablet, Take 1 tablet (300 mg total) by mouth daily., Disp: 90 tablet, Rfl: 0   buPROPion (WELLBUTRIN XL) 300 MG 24 hr tablet, Take 1 tablet (300 mg total) by mouth daily., Disp: 90 tablet, Rfl: 1   Cholecalciferol 2000 units TABS, Take 1 tablet (2,000 Units total) by mouth daily., Disp: 90 tablet, Rfl: 1   eletriptan (RELPAX) 40 MG tablet, Take 1 tablet (40 mg total) by mouth as needed for migraine or headache. May repeat in 2 hours if headache persists or recurs., Disp: 10 tablet, Rfl: 3   fluticasone (FLONASE) 50 MCG/ACT nasal spray, Place 2 sprays into both nostrils daily., Disp: 16 g, Rfl: 0   irbesartan (AVAPRO) 300 MG tablet, Take 1 tablet (300 mg total) by mouth daily., Disp: 90 tablet, Rfl: 1   omeprazole (PRILOSEC) 20 MG capsule, Take 1 capsule (20 mg total) by mouth daily as needed., Disp: 90  capsule, Rfl: 1   ondansetron (ZOFRAN) 8 MG tablet, TAKE 1 TABLET (8 MG TOTAL) BY MOUTH EVERY 8 HOURS AS NEEDED FOR NAUSEA OR VOMITING., Disp: 20 tablet, Rfl: 5   predniSONE (STERAPRED UNI-PAK 21 TAB) 10 MG (21) TBPK tablet, 6 day taper; take as directed on package instructions, Disp: 21 tablet, Rfl: 0   propranolol (INDERAL) 20 MG tablet, Take 1 tablet (20 mg total) by mouth 3 (three) times daily., Disp: 270 tablet, Rfl: 1  Allergies  Allergen Reactions   Sulfamethoxazole-Trimethoprim Other (See Comments)    REACTION: Pt does not remember, was an infant          Objective:  Physical Exam  General: AAO x3, NAD  Dermatological: Skin is warm, dry and supple bilateral. There are no open sores, no preulcerative lesions, no rash or signs of infection present.  Vascular: Dorsalis Pedis artery and Posterior Tibial artery pedal pulses are 2/4 bilateral with immedate capillary fill time.There is no pain with calf compression, swelling, warmth, erythema.   Neruologic: Grossly intact via light touch bilateral.   Musculoskeletal: Moderate bunion is present on the right foot.  There are some mild numbness and localized to this area but negative Tinel sign.  No pain or crepitation injury to motion.  No significant discomfort in the course or insertion of the plantar fascia  today.  No area of pinpoint tenderness.  Muscular strength 5/5 in all groups tested bilateral.  Gait: Unassisted, Nonantalgic.       Assessment:   Plantar fasciitis, bunion with nerve symptoms     Plan:  -Treatment options discussed including all alternatives, risks, and complications -Etiology of symptoms were discussed -X-rays were obtained and reviewed with the patient.  Moderate bunions are present.  No evidence of acute fracture noted.  Posterior calcaneal spurring is present. -He was measured for new orthotics today with our orthotist, Adam Dunn. -Regards to the bunion having some nerve symptoms which I do think is  coming from the bunion.  To monitor this is any worsening or further investigate other causes for the nerve issues I think is coming from a localized nerve compression.  We discussed with conservative as well as surgical options.  He is to continue conservative care for now but consider surgery in the future.  Continue with orthotics, shoe modifications, offloading.  Can use topical medications as needed as well to help such as Voltaren gel.  Trula Slade DPM

## 2021-01-20 ENCOUNTER — Other Ambulatory Visit (HOSPITAL_COMMUNITY): Payer: Self-pay

## 2021-01-20 MED ORDER — CARESTART COVID-19 HOME TEST VI KIT
PACK | 0 refills | Status: DC
  Filled 2021-01-20: qty 4, 4d supply, fill #0

## 2021-01-28 ENCOUNTER — Other Ambulatory Visit (HOSPITAL_COMMUNITY): Payer: Self-pay

## 2021-02-03 ENCOUNTER — Telehealth: Payer: Self-pay | Admitting: Podiatry

## 2021-02-03 NOTE — Telephone Encounter (Signed)
Orthotics in.. lvm for pt to call to schedule an appt to pick them up.

## 2021-02-19 ENCOUNTER — Telehealth: Payer: Self-pay | Admitting: Podiatry

## 2021-02-19 NOTE — Telephone Encounter (Signed)
Lvm for patient to call and schedule an appointment for orthotic pick up

## 2021-02-24 ENCOUNTER — Other Ambulatory Visit: Payer: Self-pay

## 2021-02-24 ENCOUNTER — Ambulatory Visit: Payer: No Typology Code available for payment source

## 2021-02-24 DIAGNOSIS — M21619 Bunion of unspecified foot: Secondary | ICD-10-CM

## 2021-02-24 DIAGNOSIS — M722 Plantar fascial fibromatosis: Secondary | ICD-10-CM

## 2021-02-24 NOTE — Progress Notes (Signed)
SITUATION: Reason for Visit: Fitting and Delivery of Custom Fabricated Foot Orthoses Patient Report: Patient reports comfort and is satisfied with device.  OBJECTIVE DATA: Patient History / Diagnosis:     ICD-10-CM   1. Bunion  M21.619     2. Plantar fasciitis  M72.2       Provided Device:  Custom Functional Foot Orthotics     [facility]: [id number]  GOAL OF ORTHOSIS - Improve gait - Decrease energy expenditure - Improve Balance - Provide Triplanar stability of foot complex - Facilitate motion  ACTIONS PERFORMED Patient was fit with foot orthotics trimmed to shoe last. Patient tolerated fittign procedure.   Patient was provided with verbal and written instruction and demonstration regarding donning, doffing, wear, care, proper fit, function, purpose, cleaning, and use of the orthosis and in all related precautions and risks and benefits regarding the orthosis.  Patient was also provided with verbal instruction regarding how to report any failures or malfunctions of the orthosis and necessary follow up care. Patient was also instructed to contact our office regarding any change in status that may affect the function of the orthosis.  Patient demonstrated independence with proper donning, doffing, and fit and verbalized understanding of all instructions.  PLAN: Patient is to follow up in one week or as necessary (PRN). All questions were answered and concerns addressed. Plan of care was discussed with and agreed upon by the patient.

## 2021-03-21 ENCOUNTER — Other Ambulatory Visit (HOSPITAL_COMMUNITY): Payer: Self-pay

## 2021-04-03 ENCOUNTER — Other Ambulatory Visit (HOSPITAL_COMMUNITY): Payer: Self-pay

## 2021-04-03 MED ORDER — CARESTART COVID-19 HOME TEST VI KIT
PACK | 0 refills | Status: DC
  Filled 2021-04-03: qty 4, 8d supply, fill #0

## 2021-04-25 ENCOUNTER — Encounter: Payer: Self-pay | Admitting: Internal Medicine

## 2021-04-26 ENCOUNTER — Other Ambulatory Visit (HOSPITAL_COMMUNITY): Payer: Self-pay

## 2021-05-01 ENCOUNTER — Other Ambulatory Visit: Payer: Self-pay | Admitting: Internal Medicine

## 2021-05-01 ENCOUNTER — Other Ambulatory Visit (HOSPITAL_COMMUNITY): Payer: Self-pay

## 2021-05-01 ENCOUNTER — Encounter: Payer: Self-pay | Admitting: Internal Medicine

## 2021-05-01 DIAGNOSIS — J4530 Mild persistent asthma, uncomplicated: Secondary | ICD-10-CM

## 2021-05-01 MED ORDER — FLUTICASONE-SALMETEROL 115-21 MCG/ACT IN AERO
2.0000 | INHALATION_SPRAY | Freq: Two times a day (BID) | RESPIRATORY_TRACT | 3 refills | Status: DC
  Filled 2021-05-01: qty 3, fill #0
  Filled 2021-05-01: qty 12, 30d supply, fill #0

## 2021-05-01 MED ORDER — ALBUTEROL SULFATE HFA 108 (90 BASE) MCG/ACT IN AERS
2.0000 | INHALATION_SPRAY | Freq: Four times a day (QID) | RESPIRATORY_TRACT | 2 refills | Status: DC | PRN
Start: 2021-05-01 — End: 2021-12-25
  Filled 2021-05-01: qty 18, 25d supply, fill #0

## 2021-05-01 MED ORDER — FLUTICASONE-SALMETEROL 250-50 MCG/ACT IN AEPB
1.0000 | INHALATION_SPRAY | Freq: Two times a day (BID) | RESPIRATORY_TRACT | 1 refills | Status: DC
  Filled 2021-05-01: qty 120, 60d supply, fill #0

## 2021-05-09 ENCOUNTER — Other Ambulatory Visit (HOSPITAL_COMMUNITY): Payer: Self-pay

## 2021-05-21 ENCOUNTER — Other Ambulatory Visit (HOSPITAL_COMMUNITY): Payer: Self-pay

## 2021-05-23 ENCOUNTER — Other Ambulatory Visit (HOSPITAL_COMMUNITY): Payer: Self-pay

## 2021-06-16 ENCOUNTER — Encounter: Payer: Self-pay | Admitting: Internal Medicine

## 2021-06-16 ENCOUNTER — Ambulatory Visit (INDEPENDENT_AMBULATORY_CARE_PROVIDER_SITE_OTHER): Payer: No Typology Code available for payment source | Admitting: Internal Medicine

## 2021-06-16 VITALS — BP 120/72 | HR 75 | Temp 98.2°F | Ht 66.0 in | Wt 275.0 lb

## 2021-06-16 DIAGNOSIS — H109 Unspecified conjunctivitis: Secondary | ICD-10-CM | POA: Diagnosis not present

## 2021-06-16 DIAGNOSIS — J029 Acute pharyngitis, unspecified: Secondary | ICD-10-CM | POA: Diagnosis not present

## 2021-06-16 DIAGNOSIS — I10 Essential (primary) hypertension: Secondary | ICD-10-CM | POA: Diagnosis not present

## 2021-06-16 MED ORDER — DOXYCYCLINE HYCLATE 100 MG PO TABS: 100.0000 mg | ORAL_TABLET | Freq: Two times a day (BID) | ORAL | 0 refills | Status: DC

## 2021-06-16 MED ORDER — ERYTHROMYCIN 5 MG/GM OP OINT: 1.0000 "application " | TOPICAL_OINTMENT | Freq: Four times a day (QID) | OPHTHALMIC | 0 refills | Status: AC

## 2021-06-16 NOTE — Assessment & Plan Note (Signed)
BP Readings from Last 3 Encounters:  06/16/21 120/72  12/05/20 124/86  06/17/20 134/82   Stable, pt to continue medical treatment avapro 300 mg qd

## 2021-06-16 NOTE — Assessment & Plan Note (Signed)
Mild to mod, for doxycycline 100 bid,  to f/u any worsening symptoms or concerns

## 2021-06-16 NOTE — Progress Notes (Signed)
Patient ID: Adam Dunn, male   DOB: 1980/02/05, 41 y.o.   MRN: [id number]        Chief Complaint: follow up ST and eye d/c x 3 days       HPI:  Adam Dunn is a 41 y.o. male here with c/o 3 days onset severe ST and eye weepy d/c and matting bilateral with feeling ill, feverish, mild weak; after exposure to strep throat with wife and child, now being tx with antibx.  Pt denies chest pain, increased sob or doe, wheezing, orthopnea, PND, increased LE swelling, palpitations, dizziness or syncope.   Pt denies polydipsia, polyuria       Wt Readings from Last 3 Encounters:  06/16/21 275 lb (124.7 kg)  12/05/20 284 lb (128.8 kg)  06/17/20 280 lb (127 kg)   BP Readings from Last 3 Encounters:  06/16/21 120/72  12/05/20 124/86  06/17/20 134/82         Past Medical History:  Diagnosis Date   Migraines    Past Surgical History:  Procedure Laterality Date   HERNIA REPAIR     infant    reports that he has never smoked. He has never used smokeless tobacco. He reports current alcohol use. He reports that he does not use drugs. family history includes Arthritis in his mother; Coronary artery disease in his maternal grandfather; Emphysema in his maternal grandfather; Fibromyalgia in his mother; Healthy in his brother and father; Hypertension in his mother; Migraines in his mother. Allergies  Allergen Reactions   Sulfamethoxazole-Trimethoprim Other (See Comments)    REACTION: Pt does not remember, was an infant   Current Outpatient Medications on File Prior to Visit  Medication Sig Dispense Refill   albuterol (VENTOLIN HFA) 108 (90 Base) MCG/ACT inhaler Inhale 2 puffs into the lungs every 6 (six) hours as needed for wheezing or shortness of breath. 18 g 2   buPROPion (WELLBUTRIN XL) 300 MG 24 hr tablet Take 1 tablet (300 mg total) by mouth daily. 90 tablet 0   buPROPion (WELLBUTRIN XL) 300 MG 24 hr tablet Take 1 tablet (300 mg total) by mouth daily. 90 tablet 1   Cholecalciferol 2000 units  TABS Take 1 tablet (2,000 Units total) by mouth daily. 90 tablet 1   COVID-19 At Home Antigen Test (CARESTART COVID-19 HOME TEST) KIT Use as directed 4 each 0   eletriptan (RELPAX) 40 MG tablet Take 1 tablet (40 mg total) by mouth as needed for migraine or headache. May repeat in 2 hours if headache persists or recurs. 10 tablet 3   fluticasone (FLONASE) 50 MCG/ACT nasal spray Place 2 sprays into both nostrils daily. 16 g 0   fluticasone-salmeterol (ADVAIR DISKUS) 250-50 MCG/ACT AEPB Inhale 1 puff into the lungs in the morning and at bedtime. 120 each 1   irbesartan (AVAPRO) 300 MG tablet Take 1 tablet (300 mg total) by mouth daily. 90 tablet 1   omeprazole (PRILOSEC) 20 MG capsule Take 1 capsule (20 mg total) by mouth daily as needed. 90 capsule 1   ondansetron (ZOFRAN) 8 MG tablet TAKE 1 TABLET (8 MG TOTAL) BY MOUTH EVERY 8 HOURS AS NEEDED FOR NAUSEA OR VOMITING. 20 tablet 5   predniSONE (STERAPRED UNI-PAK 21 TAB) 10 MG (21) TBPK tablet 6 day taper; take as directed on package instructions 21 tablet 0   propranolol (INDERAL) 20 MG tablet Take 1 tablet (20 mg total) by mouth 3 (three) times daily. 270 tablet 1   No current facility-administered medications  on file prior to visit.        ROS:  All others reviewed and negative.  Objective        PE:  BP 120/72 (BP Location: Right Arm, Patient Position: Sitting, Cuff Size: Large)   Pulse 75   Temp 98.2 F (36.8 C) (Oral)   Ht _0  (1.676 m)   Wt 275 lb (124.7 kg)   SpO2 98%   BMI 44.39 kg/m                 Constitutional: Pt appears in NAD               HENT: Head: NCAT.                Right Ear: External ear normal.                 Left Ear: External ear normal.                Eyes: . Pupils are equal, round, and reactive to light. Conjunctivae with bilateral slight d/c and erythema, and EOM are normal               Nose: without d/c or deformity; Bilat tm's with severe erythema.  Max sinus areas non tender.  Pharynx with mild  erythema, no exudate                 Neck: Neck supple. Gross normal ROM, with mild bilateral submandibular LA               Cardiovascular: Normal rate and regular rhythm.                 Pulmonary/Chest: Effort normal and breath sounds without rales or wheezing.                               Neurological: Pt is alert. At baseline orientation, motor grossly intact               Skin: Skin is warm. No rashes, no other new lesions, LE edema - none               Psychiatric: Pt behavior is normal without agitation   Micro: none  Cardiac tracings I have personally interpreted today:  none  Pertinent Radiological findings (summarize): none   Lab Results  Component Value Date   WBC 6.6 12/05/2020   HGB 14.0 12/05/2020   HCT 42.1 12/05/2020   PLT 276.0 12/05/2020   GLUCOSE 110 (H) 12/05/2020   CHOL 167 06/17/2020   TRIG 112.0 06/17/2020   HDL 44.20 06/17/2020   LDLCALC 100 (H) 06/17/2020   ALT 40 06/17/2020   AST 20 06/17/2020   NA 140 12/05/2020   K 4.3 12/05/2020   CL 106 12/05/2020   CREATININE 0.91 12/05/2020   BUN 15 12/05/2020   CO2 32 12/05/2020   TSH 4.26 01/25/2019   PSA 1.02 06/17/2020   INR 1.0 06/17/2020   HGBA1C 5.5 01/25/2019   Assessment/Plan:  Adam Dunn is a 41 y.o. White or Caucasian [1] male with  has a past medical history of Migraines.  Acute pharyngitis Mild to mod, for doxycycline 100 bid,  to f/u any worsening symptoms or concerns  Bilateral conjunctivitis Mild to mod, for antibx course - eryth op bid asd,  to f/u any worsening symptoms or concerns  Essential hypertension BP Readings from Last 3  Encounters:  06/16/21 120/72  12/05/20 124/86  06/17/20 134/82   Stable, pt to continue medical treatment avapro 300 mg qd  Followup: Return if symptoms worsen or fail to improve.  Cathlean Cower, MD 06/16/2021 8:17 PM [facility]

## 2021-06-16 NOTE — Patient Instructions (Signed)
Please take all new medication as prescribed - the antibiotic pills and eye ointment  Please continue all other medications as before, and refills have been done if requested.  Please have the pharmacy call with any other refills you may need.  Please keep your appointments with your specialists as you may have planned

## 2021-06-16 NOTE — Assessment & Plan Note (Signed)
Mild to mod, for antibx course - eryth op bid asd,  to f/u any worsening symptoms or concerns

## 2021-06-24 ENCOUNTER — Encounter: Payer: Self-pay | Admitting: Internal Medicine

## 2021-06-25 ENCOUNTER — Other Ambulatory Visit (HOSPITAL_COMMUNITY): Payer: Self-pay

## 2021-06-25 MED ORDER — HYDROCODONE BIT-HOMATROP MBR 5-1.5 MG/5ML PO SOLN
5.0000 mL | Freq: Four times a day (QID) | ORAL | 0 refills | Status: AC | PRN
  Filled 2021-06-25: qty 180, 9d supply, fill #0

## 2021-06-28 ENCOUNTER — Other Ambulatory Visit (HOSPITAL_COMMUNITY): Payer: Self-pay

## 2021-07-16 ENCOUNTER — Other Ambulatory Visit (HOSPITAL_COMMUNITY): Payer: Self-pay

## 2021-07-16 MED ORDER — BUPROPION HCL ER (XL) 300 MG PO TB24
300.0000 mg | ORAL_TABLET | Freq: Every day | ORAL | 1 refills | Status: DC
  Filled 2021-07-16: qty 90, 90d supply, fill #0

## 2021-07-16 MED ORDER — HYDROXYZINE PAMOATE 25 MG PO CAPS
25.0000 mg | ORAL_CAPSULE | Freq: Every morning | ORAL | 1 refills | Status: DC
  Filled 2021-07-16: qty 45, 45d supply, fill #0

## 2021-07-18 ENCOUNTER — Other Ambulatory Visit: Payer: Self-pay | Admitting: Internal Medicine

## 2021-07-18 ENCOUNTER — Other Ambulatory Visit (HOSPITAL_COMMUNITY): Payer: Self-pay

## 2021-07-18 DIAGNOSIS — I10 Essential (primary) hypertension: Secondary | ICD-10-CM

## 2021-07-18 MED ORDER — IRBESARTAN 300 MG PO TABS
300.0000 mg | ORAL_TABLET | Freq: Every day | ORAL | 1 refills | Status: DC
  Filled 2021-07-18: qty 90, 90d supply, fill #0
  Filled 2021-11-14: qty 90, 90d supply, fill #1

## 2021-07-21 ENCOUNTER — Other Ambulatory Visit (HOSPITAL_COMMUNITY): Payer: Self-pay

## 2021-09-15 ENCOUNTER — Encounter: Payer: Self-pay | Admitting: Podiatry

## 2021-09-18 ENCOUNTER — Ambulatory Visit (INDEPENDENT_AMBULATORY_CARE_PROVIDER_SITE_OTHER): Payer: No Typology Code available for payment source

## 2021-09-18 ENCOUNTER — Ambulatory Visit (INDEPENDENT_AMBULATORY_CARE_PROVIDER_SITE_OTHER): Payer: No Typology Code available for payment source | Admitting: Podiatry

## 2021-09-18 DIAGNOSIS — S92534A Nondisplaced fracture of distal phalanx of right lesser toe(s), initial encounter for closed fracture: Secondary | ICD-10-CM

## 2021-09-18 DIAGNOSIS — T1490XA Injury, unspecified, initial encounter: Secondary | ICD-10-CM

## 2021-09-18 NOTE — Progress Notes (Signed)
Subjective:  Patient ID: Adam Dunn, male    DOB: 07-13-1980,  MRN: [id number]  Chief Complaint  Patient presents with   possible right foot pinkie toe fracture    Patient is here for possible right foot pinkie toe fracture.patient kicked something in the bathroom and injured his right foot pinkie toe.    41 y.o. male presents with concern for painful right fifth toe.  He states that he jammed his right fifth toe on a shower door frame this past weekend.  He states that he has had consistent pain since the injury.  He did have some bruising and swelling that occurred directly after the injury which has subsequently decreased but wanted to get the toe checked out and x-rayed as he is still having pain with ambulation.  He has been taking ibuprofen for pain control which is helping slightly.  Walking in regular shoes at this time.  Past Medical History:  Diagnosis Date   Migraines     Allergies  Allergen Reactions   Sulfamethoxazole-Trimethoprim Other (See Comments)    REACTION: Pt does not remember, was an infant    ROS: Negative except as per HPI above  Objective:  General: AAO x3, NAD  Dermatological: With inspection and palpation of the right and left lower extremities there are no open sores, no preulcerative lesions, no rash or signs of infection present. Nails are of normal length thickness and coloration.   Vascular:  Dorsalis Pedis artery and Posterior Tibial artery pedal pulses are 2/4 bilateral.  Capillary fill time brisk < 3 sec. Pedal hair growth present. No varicosities and no lower extremity edema present bilateral. There is no pain with calf compression, swelling, warmth, erythema.   Neruologic: Grossly intact via light touch bilateral. Protective threshold intact to all sites bilateral. Patellar and Achilles deep tendon reflexes 2+ bilateral. Negative Babinski reflex.   Musculoskeletal: Attention directed to the fifth toe on the right foot there is moderate  tenderness with palpation about the distal interphalangeal joint.  Particularly pain with palpation along the distal aspect of the middle phalanx and the distal phalanx of the fifth toe.  No pain with palpation of the metatarsals.  Mild edema and no ecchymosis of the right fifth toe.  Gait: Unassisted, Nonantalgic.   No images are attached to the encounter.  Radiographs:  Date: 09/18/2021 XR right foot weightbearing AP/Lateral/Oblique   Findings: fracture of the distal phalanx at the medial aspect of the base there is a small chip fracture present.  There is minimal to no displacement of this fracture.  The fracture does extend into the distal interphalangeal joint. Assessment:   1. Closed nondisplaced fracture of distal phalanx of lesser toe of right foot, initial encounter   2. Injury      Plan:  Patient was evaluated and treated and all questions answered.  #Right foot fifth toe distal phalanx medial base chip fracture, nondisplaced -I discussed with the patient that he has sustained a small fracture of the distal phalanx of the fifth toe of the right foot.  Fortunately this is nondisplaced and will heal well with conservative care including rest and time.  I would recommend he try buddy taping the fifth toe to the fourth toe as needed for pain control and I demonstrated this for the patient.  He should continue to take ibuprofen or Tylenol for pain control.  I offered him a postoperative shoe but he declined and said he was fine wearing his regular shoes.  I recommend  a stiff soled shoe to aid in the healing process.  I also recommend activity modification to include avoiding high impact activities including any running or jumping for the next 4 to 6 weeks.  I believe that he is pain will decrease in the next week to 2 weeks and will likely be passed this at that point in time.  If he is still having pain in the fifth toe after the 4 to 6-week mark he should call and come back in for another  set of x-rays.  Follow-up as needed if pain does not improve after 4 to 6 weeks         Everitt Amber, [place] [facility]

## 2021-10-06 ENCOUNTER — Other Ambulatory Visit (HOSPITAL_COMMUNITY): Payer: Self-pay

## 2021-10-07 ENCOUNTER — Other Ambulatory Visit: Payer: Self-pay | Admitting: Internal Medicine

## 2021-10-07 DIAGNOSIS — G43109 Migraine with aura, not intractable, without status migrainosus: Secondary | ICD-10-CM

## 2021-10-09 ENCOUNTER — Telehealth: Payer: Self-pay

## 2021-10-09 NOTE — Telephone Encounter (Signed)
Key: [id number]

## 2021-10-09 NOTE — Telephone Encounter (Signed)
Per CoverMyMeds:  This drug/product is not covered under the pharmacy benefit. Prior Authorization is not available.

## 2021-11-14 ENCOUNTER — Other Ambulatory Visit (HOSPITAL_COMMUNITY): Payer: Self-pay

## 2021-12-12 ENCOUNTER — Other Ambulatory Visit: Payer: Self-pay | Admitting: Internal Medicine

## 2021-12-12 DIAGNOSIS — G43109 Migraine with aura, not intractable, without status migrainosus: Secondary | ICD-10-CM

## 2021-12-24 ENCOUNTER — Encounter: Payer: Self-pay | Admitting: Internal Medicine

## 2021-12-25 ENCOUNTER — Ambulatory Visit (INDEPENDENT_AMBULATORY_CARE_PROVIDER_SITE_OTHER): Payer: No Typology Code available for payment source | Admitting: Internal Medicine

## 2021-12-25 ENCOUNTER — Other Ambulatory Visit (HOSPITAL_COMMUNITY): Payer: Self-pay

## 2021-12-25 ENCOUNTER — Encounter: Payer: Self-pay | Admitting: Internal Medicine

## 2021-12-25 ENCOUNTER — Ambulatory Visit (INDEPENDENT_AMBULATORY_CARE_PROVIDER_SITE_OTHER): Payer: No Typology Code available for payment source

## 2021-12-25 VITALS — BP 130/82 | HR 77 | Temp 98.8°F | Ht 66.0 in | Wt 273.8 lb

## 2021-12-25 DIAGNOSIS — I10 Essential (primary) hypertension: Secondary | ICD-10-CM

## 2021-12-25 DIAGNOSIS — Z8042 Family history of malignant neoplasm of prostate: Secondary | ICD-10-CM | POA: Diagnosis not present

## 2021-12-25 DIAGNOSIS — R051 Acute cough: Secondary | ICD-10-CM

## 2021-12-25 DIAGNOSIS — K7581 Nonalcoholic steatohepatitis (NASH): Secondary | ICD-10-CM

## 2021-12-25 DIAGNOSIS — J4521 Mild intermittent asthma with (acute) exacerbation: Secondary | ICD-10-CM

## 2021-12-25 DIAGNOSIS — Z0000 Encounter for general adult medical examination without abnormal findings: Secondary | ICD-10-CM | POA: Diagnosis not present

## 2021-12-25 DIAGNOSIS — G43109 Migraine with aura, not intractable, without status migrainosus: Secondary | ICD-10-CM

## 2021-12-25 DIAGNOSIS — J4530 Mild persistent asthma, uncomplicated: Secondary | ICD-10-CM

## 2021-12-25 DIAGNOSIS — J101 Influenza due to other identified influenza virus with other respiratory manifestations: Secondary | ICD-10-CM | POA: Diagnosis not present

## 2021-12-25 LAB — CBC WITH DIFFERENTIAL/PLATELET
Basophils Absolute: 0.1 10*3/uL (ref 0.0–0.1)
Basophils Relative: 0.8 % (ref 0.0–3.0)
Eosinophils Absolute: 0.1 10*3/uL (ref 0.0–0.7)
Eosinophils Relative: 1.9 % (ref 0.0–5.0)
HCT: 45 % (ref 39.0–52.0)
Hemoglobin: 15.2 g/dL (ref 13.0–17.0)
Lymphocytes Relative: 22.8 % (ref 12.0–46.0)
Lymphs Abs: 1.4 10*3/uL (ref 0.7–4.0)
MCHC: 33.7 g/dL (ref 30.0–36.0)
MCV: 87.7 fl (ref 78.0–100.0)
Monocytes Absolute: 1.2 10*3/uL — ABNORMAL HIGH (ref 0.1–1.0)
Monocytes Relative: 19.3 % — ABNORMAL HIGH (ref 3.0–12.0)
Neutro Abs: 3.5 10*3/uL (ref 1.4–7.7)
Neutrophils Relative %: 55.2 % (ref 43.0–77.0)
Platelets: 289 10*3/uL (ref 150.0–400.0)
RBC: 5.13 Mil/uL (ref 4.22–5.81)
RDW: 13.1 % (ref 11.5–15.5)
WBC: 6.3 10*3/uL (ref 4.0–10.5)

## 2021-12-25 LAB — BASIC METABOLIC PANEL
BUN: 16 mg/dL (ref 6–23)
CO2: 29 mEq/L (ref 19–32)
Calcium: 9.4 mg/dL (ref 8.4–10.5)
Chloride: 100 mEq/L (ref 96–112)
Creatinine, Ser: 0.92 mg/dL (ref 0.40–1.50)
GFR: 102.46 mL/min (ref >60.00)
Glucose, Bld: 83 mg/dL (ref 70–99)
Potassium: 4.7 mEq/L (ref 3.5–5.1)
Sodium: 140 mEq/L (ref 135–145)

## 2021-12-25 LAB — HEPATIC FUNCTION PANEL
ALT: 32 U/L (ref 0–53)
AST: 21 U/L (ref 0–37)
Albumin: 4.6 g/dL (ref 3.5–5.2)
Alkaline Phosphatase: 73 U/L (ref 39–117)
Bilirubin, Direct: 0.1 mg/dL (ref 0.0–0.3)
Total Bilirubin: 0.5 mg/dL (ref 0.2–1.2)
Total Protein: 7 g/dL (ref 6.0–8.3)

## 2021-12-25 LAB — POC COVID19 BINAXNOW: SARS Coronavirus 2 Ag: NEGATIVE

## 2021-12-25 LAB — POCT INFLUENZA A/B
Influenza A, POC: POSITIVE — AB
Influenza B, POC: NEGATIVE

## 2021-12-25 LAB — PSA: PSA: 0.94 ng/mL (ref 0.10–4.00)

## 2021-12-25 LAB — POCT RESPIRATORY SYNCYTIAL VIRUS: RSV Rapid Ag: NEGATIVE

## 2021-12-25 MED ORDER — OSELTAMIVIR PHOSPHATE 75 MG PO CAPS
75.0000 mg | ORAL_CAPSULE | Freq: Two times a day (BID) | ORAL | 0 refills | Status: AC
  Filled 2021-12-25: qty 10, 5d supply, fill #0

## 2021-12-25 MED ORDER — FLUTICASONE-SALMETEROL 250-50 MCG/ACT IN AEPB
1.0000 | INHALATION_SPRAY | Freq: Two times a day (BID) | RESPIRATORY_TRACT | 1 refills | Status: DC
  Filled 2021-12-25: qty 60, 30d supply, fill #0

## 2021-12-25 MED ORDER — ALBUTEROL SULFATE HFA 108 (90 BASE) MCG/ACT IN AERS
2.0000 | INHALATION_SPRAY | Freq: Four times a day (QID) | RESPIRATORY_TRACT | 2 refills | Status: DC | PRN
  Filled 2021-12-25: qty 6.7, 25d supply, fill #0

## 2021-12-25 MED ORDER — HYDROCOD POLI-CHLORPHE POLI ER 10-8 MG/5ML PO SUER
5.0000 mL | Freq: Two times a day (BID) | ORAL | 0 refills | Status: DC | PRN
  Filled 2021-12-25: qty 70, 7d supply, fill #0

## 2021-12-25 NOTE — Progress Notes (Signed)
Subjective:  Patient ID: Adam Dunn, male    DOB: 14-Jun-1980  Age: 41 y.o. MRN: [id number]  CC: Annual Exam, Hypertension, and Cough   HPI [name] presents for a CPX and f/up -   He complains of 3-day history of nonproductive cough, headache, fever to 104, sore throat, night sweats, and chills.  He says it hurts to cough but he denies pleuritic chest pain or hemoptysis.  Outpatient Medications Prior to Visit  Medication Sig Dispense Refill   buPROPion (WELLBUTRIN XL) 300 MG 24 hr tablet Take 1 tablet (300 mg total) by mouth daily. 90 tablet 1   Cholecalciferol 2000 units TABS Take 1 tablet (2,000 Units total) by mouth daily. 90 tablet 1   fluticasone (FLONASE) 50 MCG/ACT nasal spray Place 2 sprays into both nostrils daily. 16 g 0   irbesartan (AVAPRO) 300 MG tablet Take 1 tablet (300 mg total) by mouth daily. 90 tablet 1   omeprazole (PRILOSEC) 20 MG capsule Take 1 capsule (20 mg total) by mouth daily as needed. 90 capsule 1   propranolol (INDERAL) 20 MG tablet Take 1 tablet (20 mg total) by mouth 3 (three) times daily. 270 tablet 1   albuterol (VENTOLIN HFA) 108 (90 Base) MCG/ACT inhaler Inhale 2 puffs into the lungs every 6 (six) hours as needed for wheezing or shortness of breath. 18 g 2   COVID-19 At Home Antigen Test (CARESTART COVID-19 HOME TEST) KIT Use as directed 4 each 0   eletriptan (RELPAX) 40 MG tablet take 1 tablet by mouth as needed for migraine or headache **may repeat in 2 hours if headache persists or recurs 10 tablet 0   fluticasone-salmeterol (ADVAIR DISKUS) 250-50 MCG/ACT AEPB Inhale 1 puff into the lungs in the morning and at bedtime. 120 each 1   ondansetron (ZOFRAN) 8 MG tablet TAKE 1 TABLET (8 MG TOTAL) BY MOUTH EVERY 8 HOURS AS NEEDED FOR NAUSEA OR VOMITING. 20 tablet 5   predniSONE (STERAPRED UNI-PAK 21 TAB) 10 MG (21) TBPK tablet 6 day taper; take as directed on package instructions 21 tablet 0   buPROPion (WELLBUTRIN XL) 300 MG 24 hr tablet Take 1  tablet (300 mg total) by mouth daily. 90 tablet 0   buPROPion (WELLBUTRIN XL) 300 MG 24 hr tablet Take 1 tablet (300 mg total) by mouth daily. 90 tablet 1   doxycycline (VIBRA-TABS) 100 MG tablet Take 1 tablet (100 mg total) by mouth 2 (two) times daily. (Patient not taking: Reported on 12/25/2021) 20 tablet 0   hydrOXYzine (VISTARIL) 25 MG capsule Take 1 capsule (25 mg total) by mouth every morning. (Patient not taking: Reported on 12/25/2021) 45 capsule 1   No facility-administered medications prior to visit.    ROS Review of Systems  Constitutional:  Positive for chills, fatigue and fever. Negative for diaphoresis and unexpected weight change.  HENT:  Positive for sore throat. Negative for trouble swallowing.   Respiratory:  Positive for cough. Negative for chest tightness, shortness of breath and wheezing.   Cardiovascular:  Negative for chest pain, palpitations and leg swelling.  Gastrointestinal:  Negative for abdominal pain, diarrhea and vomiting.  Genitourinary: Negative.  Negative for difficulty urinating.  Musculoskeletal: Negative.   Skin: Negative.   Neurological:  Positive for headaches. Negative for dizziness, weakness and light-headedness.  Hematological:  Negative for adenopathy. Does not bruise/bleed easily.  Psychiatric/Behavioral: Negative.      Objective:  BP 130/82 (BP Location: Left Arm, Patient Position: Sitting, Cuff Size: Normal)  Pulse 77   Temp 98.8 F (37.1 C) (Oral)   Ht _0  (1.676 m)   Wt 273 lb 12.8 oz (124.2 kg)   SpO2 97%   BMI 44.19 kg/m   BP Readings from Last 3 Encounters:  12/25/21 130/82  06/16/21 120/72  12/05/20 124/86    Wt Readings from Last 3 Encounters:  12/25/21 273 lb 12.8 oz (124.2 kg)  06/16/21 275 lb (124.7 kg)  12/05/20 284 lb (128.8 kg)    Physical Exam Vitals reviewed.  Constitutional:      General: He is not in acute distress.    Appearance: He is not ill-appearing, toxic-appearing or diaphoretic.  HENT:      Mouth/Throat:     Mouth: Mucous membranes are moist.  Eyes:     General: No scleral icterus.    Conjunctiva/sclera: Conjunctivae normal.  Cardiovascular:     Rate and Rhythm: Normal rate and regular rhythm.     Heart sounds: No murmur heard. Pulmonary:     Effort: Pulmonary effort is normal.     Breath sounds: No stridor. No wheezing, rhonchi or rales.  Abdominal:     General: Abdomen is flat.     Palpations: There is no mass.     Tenderness: There is no abdominal tenderness. There is no guarding.     Hernia: No hernia is present.  Musculoskeletal:        General: Normal range of motion.     Cervical back: Neck supple.     Right lower leg: No edema.     Left lower leg: No edema.  Lymphadenopathy:     Cervical: No cervical adenopathy.  Skin:    General: Skin is warm and dry.     Coloration: Skin is not pale.     Findings: No rash.  Neurological:     General: No focal deficit present.     Mental Status: He is alert. Mental status is at baseline.  Psychiatric:        Mood and Affect: Mood normal.        Behavior: Behavior normal.     Lab Results  Component Value Date   WBC 6.3 12/25/2021   HGB 15.2 12/25/2021   HCT 45.0 12/25/2021   PLT 289.0 12/25/2021   GLUCOSE 83 12/25/2021   CHOL 167 06/17/2020   TRIG 112.0 06/17/2020   HDL 44.20 06/17/2020   LDLCALC 100 (H) 06/17/2020   ALT 32 12/25/2021   AST 21 12/25/2021   NA 140 12/25/2021   K 4.7 12/25/2021   CL 100 12/25/2021   CREATININE 0.92 12/25/2021   BUN 16 12/25/2021   CO2 29 12/25/2021   TSH 4.26 01/25/2019   PSA 0.94 12/25/2021   INR 1.0 06/17/2020   HGBA1C 5.5 01/25/2019    DG Foot Complete Right  Result Date: 11/23/2017 Please see detailed radiograph report in office note.  DG Foot Complete Left  Result Date: 11/23/2017 Please see detailed radiograph report in office note.  US Abdomen Limited RUQ  Result Date: 11/22/2017 CLINICAL DATA:  Elevated liver enzymes EXAM: ULTRASOUND ABDOMEN LIMITED  RIGHT UPPER QUADRANT COMPARISON:  None. FINDINGS: Gallbladder: No gallstones or wall thickening visualized. There is no pericholecystic fluid. No sonographic Murphy sign noted by sonographer. Common bile duct: Diameter: 3 mm. No intrahepatic or extrahepatic biliary duct dilatation. Liver: No focal lesion identified. Liver echogenicity is increased diffusely. Portal vein is patent on color Doppler imaging with normal direction of blood flow towards the liver. IMPRESSION: Diffuse  increase in liver echogenicity, a finding felt to be indicative of hepatic steatosis. While no focal liver lesions are evident on this study, it must be cautioned that the sensitivity of ultrasound for detection of focal liver lesions is diminished in this circumstance. Study otherwise unremarkable. Electronically Signed   By: Lowella Grip III M.D.   On: 11/22/2017 11:46   DG Chest 2 View  Result Date: 12/25/2021 CLINICAL DATA:  cough/fever for 3 days EXAM: CHEST - 2 VIEW COMPARISON:  12/05/2020 FINDINGS: Cardiac silhouette is unremarkable. No pneumothorax or pleural effusion. The lungs are clear. The visualized skeletal structures are unremarkable. IMPRESSION: No acute cardiopulmonary process. Electronically Signed   By: Sammie Bench M.D.   On: 12/25/2021 09:17      Assessment & Plan:   Gram was seen today for annual exam, hypertension and cough.  Diagnoses and all orders for this visit:  Nonalcoholic steatohepatitis (NASH)- His LFTs have improved. -     Hepatic function panel; Future -     Hepatic function panel  Essential hypertension- His blood pressure is adequately well-controlled. -     CBC with Differential/Platelet; Future -     Basic metabolic panel; Future -     Basic metabolic panel -     CBC with Differential/Platelet  Routine general medical examination at a health care facility- Exam completed, labs reviewed, vaccines reviewed, cancer screenings are up-to-date, patient education was given. -      PSA; Future -     PSA  Acute cough- Chest x-ray is negative for mass or infiltrate.  Testing is positive for influenza A. -     DG Chest 2 View; Future -     POC COVID-19 -     POCT Influenza A/B -     POCT respiratory syncytial virus -     chlorpheniramine-HYDROcodone (TUSSIONEX) 10-8 MG/5ML; Take 5 mLs by mouth every 12 (twelve) hours as needed for cough.  Influenza A -     oseltamivir (TAMIFLU) 75 MG capsule; Take 1 capsule (75 mg total) by mouth 2 (two) times daily for 5 days. -     chlorpheniramine-HYDROcodone (TUSSIONEX) 10-8 MG/5ML; Take 5 mLs by mouth every 12 (twelve) hours as needed for cough.  Family history of prostate cancer in father -     PSA; Future -     PSA  Mild intermittent reactive airway disease with acute exacerbation  Mild persistent reactive airway disease without complication -     albuterol (VENTOLIN HFA) 108 (90 Base) MCG/ACT inhaler; Inhale 2 puffs into the lungs every 6 (six) hours as needed for wheezing or shortness of breath. -     fluticasone-salmeterol (ADVAIR DISKUS) 250-50 MCG/ACT AEPB; Inhale 1 puff into the lungs in the morning and at bedtime.  Migraine with aura and without status migrainosus, not intractable -     eletriptan (RELPAX) 40 MG tablet; Take 1 tablet (40 mg total) by mouth every 2 (two) hours as needed for migraine or headache. May repeat in 2 hours if headache persists or recurs.   I have discontinued Jaquis P. Schlauch's ondansetron, predniSONE, Carestart COVID-19 Home Test, doxycycline, and hydrOXYzine. I have also changed his eletriptan. Additionally, I am having him start on oseltamivir and chlorpheniramine-HYDROcodone. Lastly, I am having him maintain his Cholecalciferol, omeprazole, propranolol, buPROPion, fluticasone, buPROPion, buPROPion, irbesartan, albuterol, and fluticasone-salmeterol.  Meds ordered this encounter  Medications   oseltamivir (TAMIFLU) 75 MG capsule    Sig: Take 1 capsule (75 mg total) by mouth  2 (two) times  daily for 5 days.    Dispense:  10 capsule    Refill:  0   chlorpheniramine-HYDROcodone (TUSSIONEX) 10-8 MG/5ML    Sig: Take 5 mLs by mouth every 12 (twelve) hours as needed for cough.    Dispense:  70 mL    Refill:  0   albuterol (VENTOLIN HFA) 108 (90 Base) MCG/ACT inhaler    Sig: Inhale 2 puffs into the lungs every 6 (six) hours as needed for wheezing or shortness of breath.    Dispense:  6.7 g    Refill:  2   fluticasone-salmeterol (ADVAIR DISKUS) 250-50 MCG/ACT AEPB    Sig: Inhale 1 puff into the lungs in the morning and at bedtime.    Dispense:  120 each    Refill:  1   eletriptan (RELPAX) 40 MG tablet    Sig: Take 1 tablet (40 mg total) by mouth every 2 (two) hours as needed for migraine or headache. May repeat in 2 hours if headache persists or recurs.    Dispense:  10 tablet    Refill:  5     Follow-up: Return in about 6 months (around 06/26/2022).  Scarlette Calico, MD

## 2021-12-25 NOTE — Patient Instructions (Signed)

## 2021-12-27 MED ORDER — ELETRIPTAN HYDROBROMIDE 40 MG PO TABS
40.0000 mg | ORAL_TABLET | ORAL | 5 refills | Status: DC | PRN
  Filled 2021-12-27: qty 10, 1d supply, fill #0

## 2021-12-30 ENCOUNTER — Other Ambulatory Visit: Payer: Self-pay | Admitting: Internal Medicine

## 2021-12-30 ENCOUNTER — Other Ambulatory Visit (HOSPITAL_COMMUNITY): Payer: Self-pay

## 2021-12-30 DIAGNOSIS — G43109 Migraine with aura, not intractable, without status migrainosus: Secondary | ICD-10-CM

## 2021-12-30 MED ORDER — RIZATRIPTAN BENZOATE 10 MG PO TABS
10.0000 mg | ORAL_TABLET | ORAL | 2 refills | Status: DC | PRN
  Filled 2021-12-30: qty 10, 30d supply, fill #0

## 2022-01-01 ENCOUNTER — Other Ambulatory Visit: Payer: Self-pay | Admitting: Internal Medicine

## 2022-01-01 DIAGNOSIS — G43109 Migraine with aura, not intractable, without status migrainosus: Secondary | ICD-10-CM

## 2022-01-13 DIAGNOSIS — H524 Presbyopia: Secondary | ICD-10-CM | POA: Diagnosis not present

## 2022-01-13 DIAGNOSIS — H10413 Chronic giant papillary conjunctivitis, bilateral: Secondary | ICD-10-CM | POA: Diagnosis not present

## 2022-01-14 ENCOUNTER — Other Ambulatory Visit (HOSPITAL_COMMUNITY): Payer: Self-pay

## 2022-01-14 DIAGNOSIS — F331 Major depressive disorder, recurrent, moderate: Secondary | ICD-10-CM | POA: Diagnosis not present

## 2022-01-14 DIAGNOSIS — F411 Generalized anxiety disorder: Secondary | ICD-10-CM | POA: Diagnosis not present

## 2022-01-14 MED ORDER — BUPROPION HCL ER (XL) 300 MG PO TB24
300.0000 mg | ORAL_TABLET | Freq: Every day | ORAL | 1 refills | Status: DC
  Filled 2022-01-14 – 2022-01-28 (×2): qty 90, 90d supply, fill #0
  Filled 2022-06-10 – 2022-06-12 (×2): qty 90, 90d supply, fill #1

## 2022-01-14 MED ORDER — HYDROXYZINE PAMOATE 25 MG PO CAPS
25.0000 mg | ORAL_CAPSULE | Freq: Every morning | ORAL | 1 refills | Status: DC
  Filled 2022-01-14: qty 45, 45d supply, fill #0

## 2022-01-16 ENCOUNTER — Other Ambulatory Visit: Payer: Self-pay | Admitting: Internal Medicine

## 2022-01-16 DIAGNOSIS — G43109 Migraine with aura, not intractable, without status migrainosus: Secondary | ICD-10-CM

## 2022-01-23 ENCOUNTER — Other Ambulatory Visit (HOSPITAL_COMMUNITY): Payer: Self-pay

## 2022-01-26 ENCOUNTER — Other Ambulatory Visit (HOSPITAL_COMMUNITY): Payer: Self-pay

## 2022-01-26 ENCOUNTER — Other Ambulatory Visit: Payer: Self-pay | Admitting: Internal Medicine

## 2022-01-26 ENCOUNTER — Encounter: Payer: Self-pay | Admitting: Internal Medicine

## 2022-01-26 DIAGNOSIS — G43109 Migraine with aura, not intractable, without status migrainosus: Secondary | ICD-10-CM

## 2022-01-26 MED ORDER — ELETRIPTAN HYDROBROMIDE 40 MG PO TABS: 40.0000 mg | ORAL_TABLET | ORAL | 4 refills | Status: DC | PRN

## 2022-01-28 ENCOUNTER — Telehealth: Payer: Self-pay

## 2022-01-28 ENCOUNTER — Other Ambulatory Visit: Payer: Self-pay | Admitting: Internal Medicine

## 2022-01-28 ENCOUNTER — Other Ambulatory Visit (HOSPITAL_COMMUNITY): Payer: Self-pay

## 2022-01-28 DIAGNOSIS — G43109 Migraine with aura, not intractable, without status migrainosus: Secondary | ICD-10-CM

## 2022-01-28 MED ORDER — ONDANSETRON HCL 4 MG PO TABS
4.0000 mg | ORAL_TABLET | Freq: Three times a day (TID) | ORAL | 1 refills | Status: DC | PRN
  Filled 2022-01-28: qty 65, 22d supply, fill #0

## 2022-01-28 NOTE — Telephone Encounter (Signed)
Key: [id number]  Per CoverMyMeds:  This drug/product is not covered under the pharmacy benefit. Prior Authorization is not available.

## 2022-02-05 ENCOUNTER — Other Ambulatory Visit (HOSPITAL_COMMUNITY): Payer: Self-pay

## 2022-03-07 ENCOUNTER — Other Ambulatory Visit: Payer: Self-pay | Admitting: Internal Medicine

## 2022-03-07 DIAGNOSIS — I10 Essential (primary) hypertension: Secondary | ICD-10-CM

## 2022-03-07 MED ORDER — IRBESARTAN 300 MG PO TABS
300.0000 mg | ORAL_TABLET | Freq: Every day | ORAL | 1 refills | Status: DC
  Filled 2022-03-07: qty 90, 90d supply, fill #0
  Filled 2022-06-10 – 2022-06-12 (×2): qty 90, 90d supply, fill #1

## 2022-03-08 ENCOUNTER — Other Ambulatory Visit (HOSPITAL_COMMUNITY): Payer: Self-pay

## 2022-06-04 ENCOUNTER — Telehealth: Payer: 59 | Admitting: Physician Assistant

## 2022-06-04 DIAGNOSIS — J208 Acute bronchitis due to other specified organisms: Secondary | ICD-10-CM

## 2022-06-04 MED ORDER — PROMETHAZINE-DM 6.25-15 MG/5ML PO SYRP: 5.0000 mL | ORAL_SOLUTION | Freq: Four times a day (QID) | ORAL | 0 refills | Status: DC | PRN

## 2022-06-04 MED ORDER — PREDNISONE 20 MG PO TABS: 40.0000 mg | ORAL_TABLET | Freq: Every day | ORAL | 0 refills | Status: DC

## 2022-06-04 NOTE — Progress Notes (Signed)
E-Visit for Cough  We are sorry that you are not feeling well.  Here is how we plan to help!  Based on your presentation I believe you most likely have A cough due to a virus and spasm in the airways.  This is called viral bronchitis and is best treated by rest, plenty of fluids and control of the cough.  You may use Ibuprofen or Tylenol as directed to help your symptoms.     Continue your Symbicort. I am sending in a prescription cough medication and short course of steroid to use as directed.   From your responses in the eVisit questionnaire you describe inflammation in the upper respiratory tract which is causing a significant cough.  This is commonly called Bronchitis and has four common causes:   Allergies Viral Infections Acid Reflux Bacterial Infection Allergies, viruses and acid reflux are treated by controlling symptoms or eliminating the cause. An example might be a cough caused by taking certain blood pressure medications. You stop the cough by changing the medication. Another example might be a cough caused by acid reflux. Controlling the reflux helps control the cough.  USE OF BRONCHODILATOR ("RESCUE") INHALERS: There is a risk from using your bronchodilator too frequently.  The risk is that over-reliance on a medication which only relaxes the muscles surrounding the breathing tubes can reduce the effectiveness of medications prescribed to reduce swelling and congestion of the tubes themselves.  Although you feel brief relief from the bronchodilator inhaler, your asthma may actually be worsening with the tubes becoming more swollen and filled with mucus.  This can delay other crucial treatments, such as oral steroid medications. If you need to use a bronchodilator inhaler daily, several times per day, you should discuss this with your provider.  There are probably better treatments that could be used to keep your asthma under control.     HOME CARE Only take medications as instructed  by your medical team. Complete the entire course of an antibiotic. Drink plenty of fluids and get plenty of rest. Avoid close contacts especially the very young and the elderly Cover your mouth if you cough or cough into your sleeve. Always remember to wash your hands A steam or ultrasonic humidifier can help congestion.   GET HELP RIGHT AWAY IF: You develop worsening fever. You become short of breath You cough up blood. Your symptoms persist after you have completed your treatment plan MAKE SURE YOU  Understand these instructions. Will watch your condition. Will get help right away if you are not doing well or get worse.    Thank you for choosing an e-visit.  Your e-visit answers were reviewed by a board certified advanced clinical practitioner to complete your personal care plan. Depending upon the condition, your plan could have included both over the counter or prescription medications.  Please review your pharmacy choice. Make sure the pharmacy is open so you can pick up prescription now. If there is a problem, you may contact your provider through [employer] and have the prescription routed to another pharmacy.  Your safety is important to [place]. If you have drug allergies check your prescription carefully.   For the next 24 hours you can use MyChart to ask questions about today's visit, request a non-urgent call back, or ask for a work or school excuse. You will get an email in the next two days asking about your experience. I hope that your e-visit has been valuable and will speed your recovery.

## 2022-06-04 NOTE — Progress Notes (Signed)
I have spent 5 minutes in review of e-visit questionnaire, review and updating patient chart, medical decision making and response to patient.   Cherryl Babin Cody Gloris Shiroma, PA-C    

## 2022-06-12 ENCOUNTER — Other Ambulatory Visit (HOSPITAL_COMMUNITY): Payer: Self-pay

## 2022-06-15 ENCOUNTER — Telehealth: Payer: 59 | Admitting: Physician Assistant

## 2022-06-15 ENCOUNTER — Other Ambulatory Visit (HOSPITAL_COMMUNITY): Payer: Self-pay

## 2022-06-15 DIAGNOSIS — B9689 Other specified bacterial agents as the cause of diseases classified elsewhere: Secondary | ICD-10-CM

## 2022-06-15 DIAGNOSIS — J208 Acute bronchitis due to other specified organisms: Secondary | ICD-10-CM | POA: Diagnosis not present

## 2022-06-15 MED ORDER — AZITHROMYCIN 250 MG PO TABS
ORAL_TABLET | ORAL | 0 refills | Status: AC
  Filled 2022-06-15: qty 6, 5d supply, fill #0

## 2022-06-15 MED ORDER — PREDNISONE 20 MG PO TABS
40.0000 mg | ORAL_TABLET | Freq: Every day | ORAL | 0 refills | Status: DC
  Filled 2022-06-15: qty 10, 5d supply, fill #0

## 2022-06-15 MED ORDER — BENZONATATE 100 MG PO CAPS
100.0000 mg | ORAL_CAPSULE | Freq: Three times a day (TID) | ORAL | 0 refills | Status: DC | PRN
  Filled 2022-06-15: qty 30, 10d supply, fill #0

## 2022-06-15 NOTE — Progress Notes (Signed)
E-Visit for Cough  We are sorry that you are not feeling well.  Here is how we plan to help!  Based on your presentation I believe you most likely have A cough due to bacteria.  When patients have a fever and a productive cough with a change in color or increased sputum production, we are concerned about bacterial bronchitis.  If left untreated it can progress to pneumonia.  If your symptoms do not improve with your treatment plan it is important that you contact your provider.   I have prescribed Azithromyin 250 mg: two tablets now and then one tablet daily for 4 additonal days    In addition you may use A non-prescription cough medication called Mucinex DM: take 2 tablets every 12 hours. and A prescription cough medication called Tessalon Perles 100mg . You may take 1-2 capsules every 8 hours as needed for your cough.  Prednisone 20mg  Take 2 tablets (40mg ) daily for 5 days.  Use your albuterol and Advair inhalers as prescribed.  From your responses in the eVisit questionnaire you describe inflammation in the upper respiratory tract which is causing a significant cough.  This is commonly called Bronchitis and has four common causes:   Allergies Viral Infections Acid Reflux Bacterial Infection Allergies, viruses and acid reflux are treated by controlling symptoms or eliminating the cause. An example might be a cough caused by taking certain blood pressure medications. You stop the cough by changing the medication. Another example might be a cough caused by acid reflux. Controlling the reflux helps control the cough.  USE OF BRONCHODILATOR ("RESCUE") INHALERS: There is a risk from using your bronchodilator too frequently.  The risk is that over-reliance on a medication which only relaxes the muscles surrounding the breathing tubes can reduce the effectiveness of medications prescribed to reduce swelling and congestion of the tubes themselves.  Although you feel brief relief from the bronchodilator  inhaler, your asthma may actually be worsening with the tubes becoming more swollen and filled with mucus.  This can delay other crucial treatments, such as oral steroid medications. If you need to use a bronchodilator inhaler daily, several times per day, you should discuss this with your provider.  There are probably better treatments that could be used to keep your asthma under control.     HOME CARE Only take medications as instructed by your medical team. Complete the entire course of an antibiotic. Drink plenty of fluids and get plenty of rest. Avoid close contacts especially the very young and the elderly Cover your mouth if you cough or cough into your sleeve. Always remember to wash your hands A steam or ultrasonic humidifier can help congestion.   GET HELP RIGHT AWAY IF: You develop worsening fever. You become short of breath You cough up blood. Your symptoms persist after you have completed your treatment plan MAKE SURE YOU  Understand these instructions. Will watch your condition. Will get help right away if you are not doing well or get worse.    Thank you for choosing an e-visit.  Your e-visit answers were reviewed by a board certified advanced clinical practitioner to complete your personal care plan. Depending upon the condition, your plan could have included both over the counter or prescription medications.  Please review your pharmacy choice. Make sure the pharmacy is open so you can pick up prescription now. If there is a problem, you may contact your provider through [employer] and have the prescription routed to another pharmacy.  Your safety  is important to [place]. If you have drug allergies check your prescription carefully.   For the next 24 hours you can use MyChart to ask questions about today's visit, request a non-urgent call back, or ask for a work or school excuse. You will get an email in the next two days asking about your experience. I hope that your  e-visit has been valuable and will speed your recovery.  I have spent 5 minutes in review of e-visit questionnaire, review and updating patient chart, medical decision making and response to patient.   Margaretann Loveless, PA-C

## 2022-07-07 IMAGING — DX DG CHEST 2V
2 series · 2 of 2 positions shown · non-contrast
Comparison: X-ray chest 03/18/2016.

CLINICAL DATA: Cough for 4 weeks.

EXAM:
CHEST - 2 VIEW

[chest pa]
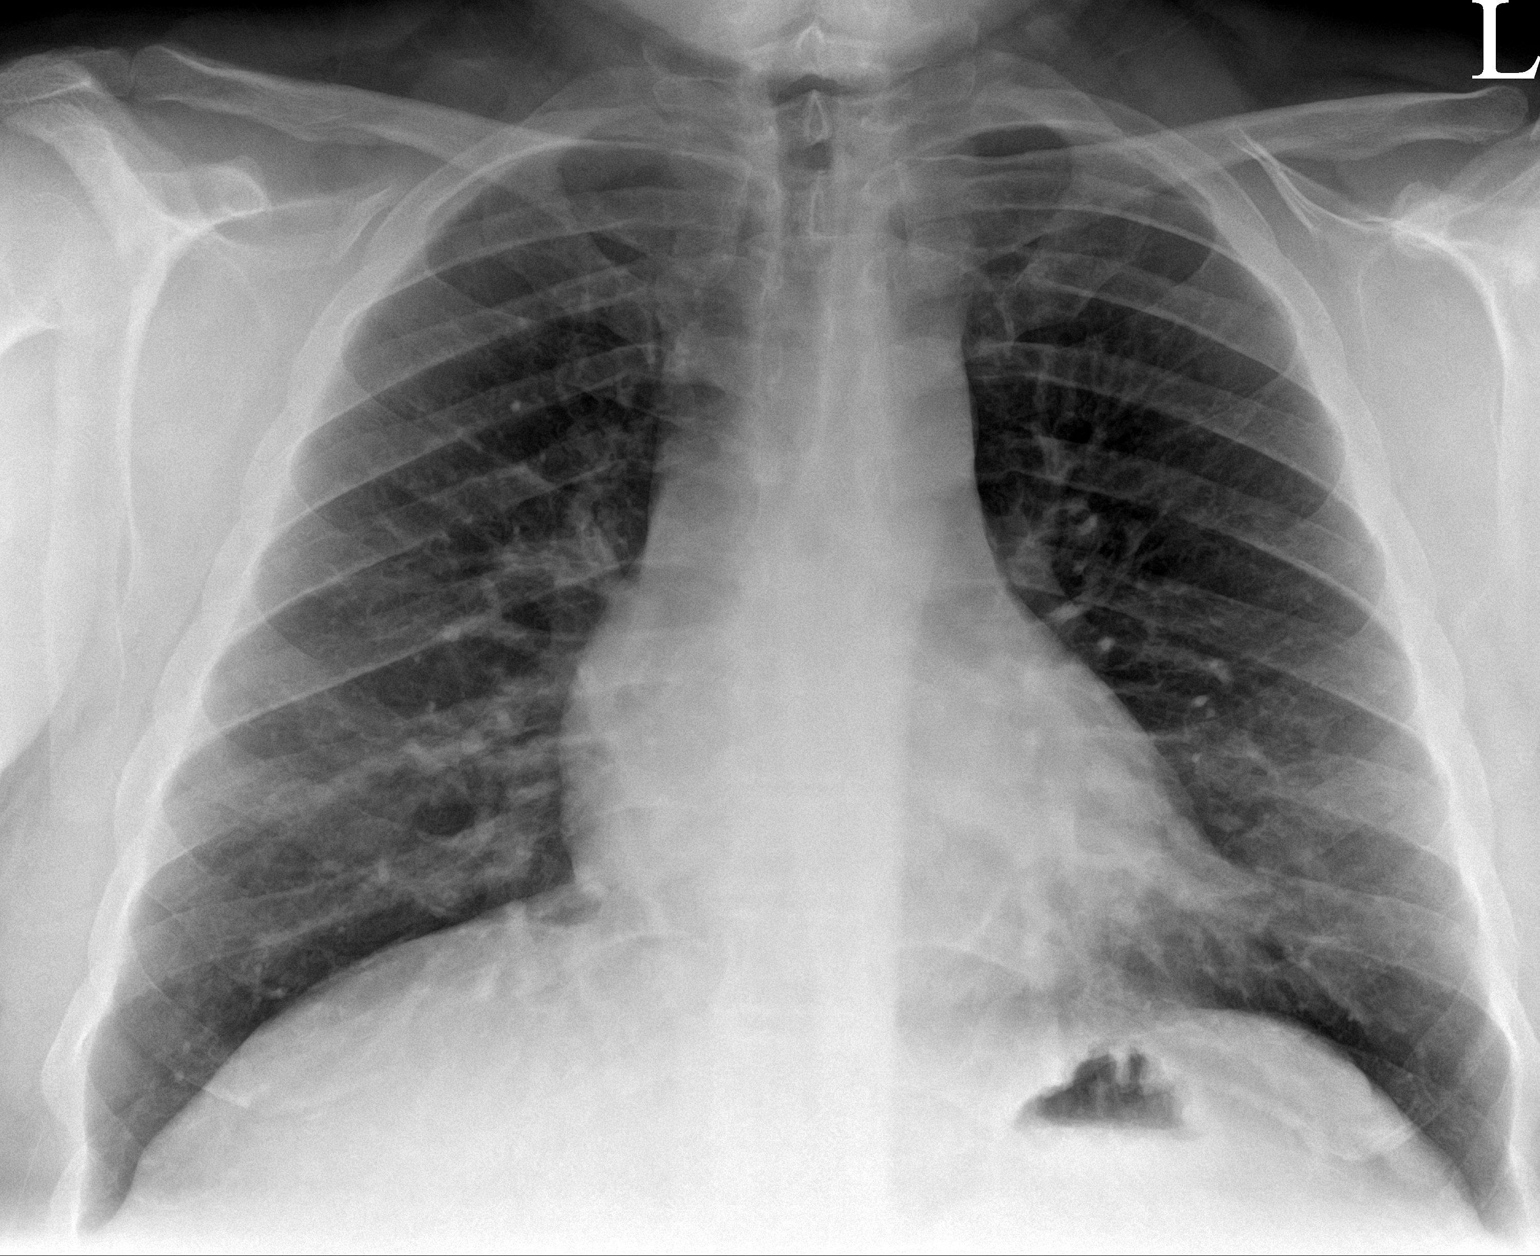

[chest lat]
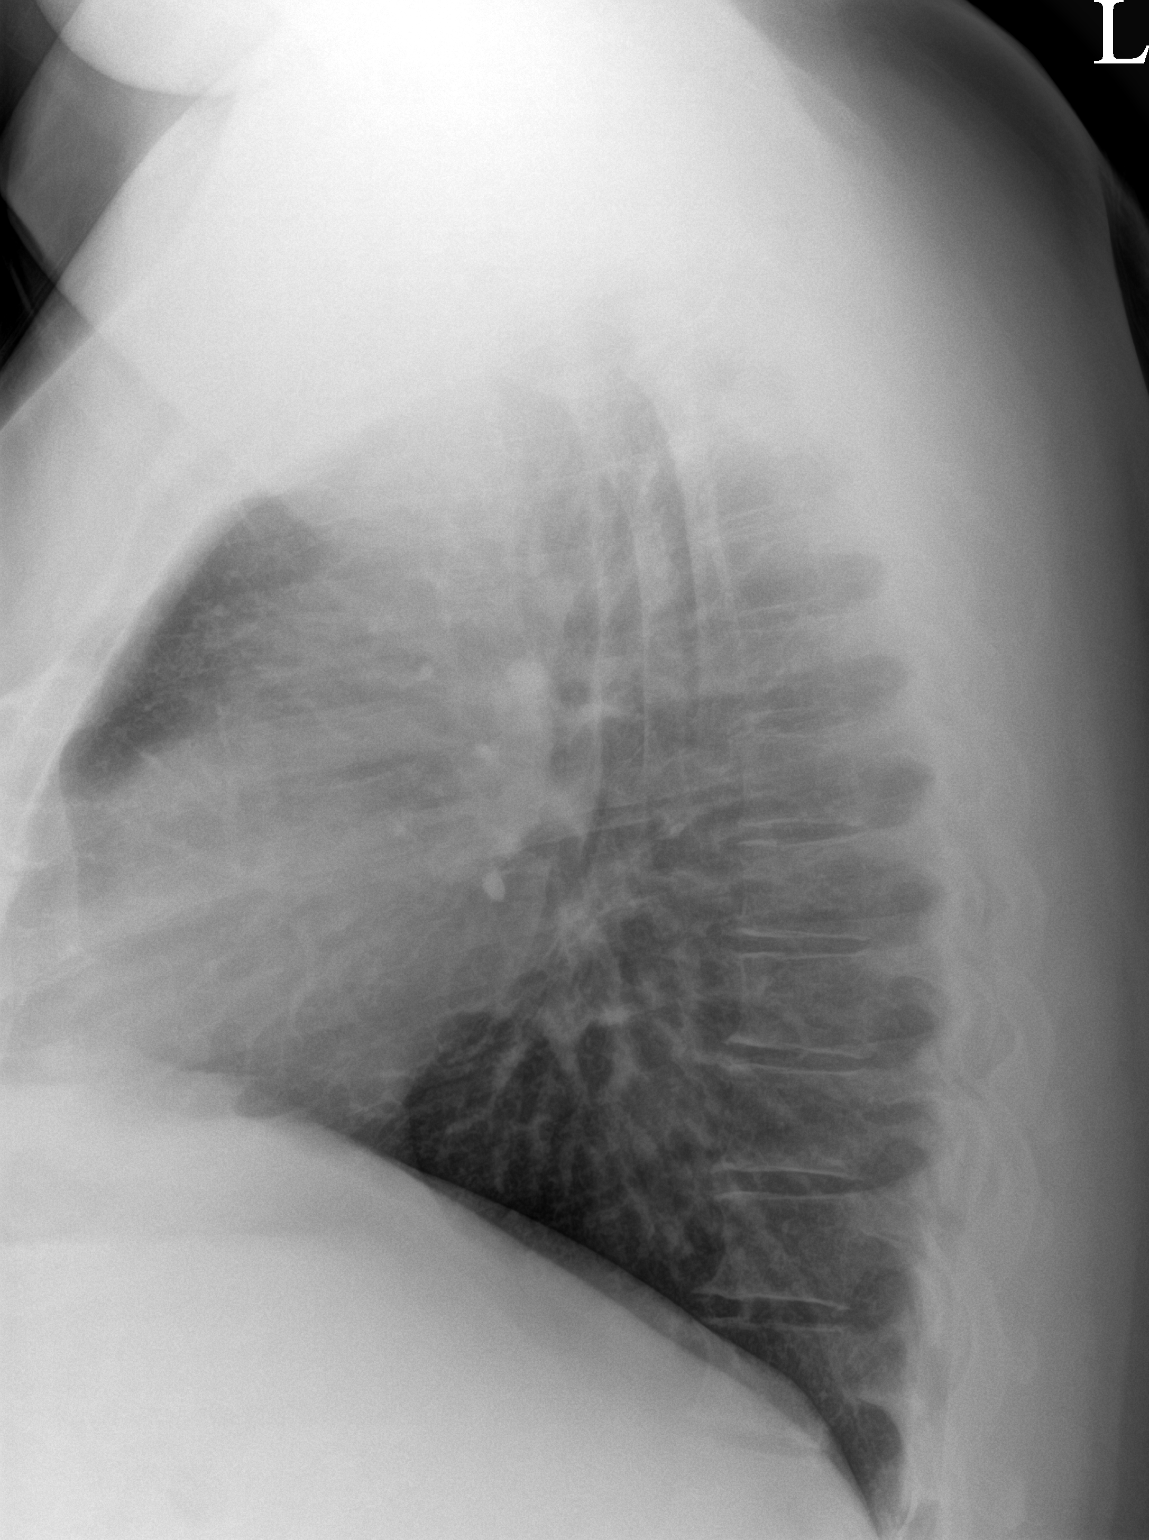

[2 of 2 positions shown; findings below may reference images not displayed]

FINDINGS: The heart size and mediastinal contours are within normal limits.
Both lungs are clear. The visualized skeletal structures are
unremarkable.
IMPRESSION: No active cardiopulmonary disease.

## 2022-07-08 ENCOUNTER — Encounter: Payer: Self-pay | Admitting: Internal Medicine

## 2022-07-08 ENCOUNTER — Other Ambulatory Visit: Payer: Self-pay | Admitting: Internal Medicine

## 2022-07-08 DIAGNOSIS — G43109 Migraine with aura, not intractable, without status migrainosus: Secondary | ICD-10-CM

## 2022-07-08 MED ORDER — ELETRIPTAN HYDROBROMIDE 40 MG PO TABS: 40.0000 mg | ORAL_TABLET | ORAL | 0 refills | Status: DC | PRN

## 2022-07-08 MED ORDER — RIZATRIPTAN BENZOATE 10 MG PO TABS
10.0000 mg | ORAL_TABLET | ORAL | 0 refills | Status: AC | PRN
Start: 2022-07-08 — End: ?

## 2022-09-21 ENCOUNTER — Encounter: Payer: Self-pay | Admitting: Internal Medicine

## 2022-10-02 ENCOUNTER — Ambulatory Visit (INDEPENDENT_AMBULATORY_CARE_PROVIDER_SITE_OTHER): Payer: 59 | Admitting: Internal Medicine

## 2022-10-02 ENCOUNTER — Encounter: Payer: Self-pay | Admitting: Internal Medicine

## 2022-10-02 ENCOUNTER — Other Ambulatory Visit (HOSPITAL_COMMUNITY): Payer: Self-pay

## 2022-10-02 VITALS — BP 136/94 | HR 84 | Temp 98.2°F | Resp 16 | Ht 66.0 in | Wt 297.0 lb

## 2022-10-02 DIAGNOSIS — I10 Essential (primary) hypertension: Secondary | ICD-10-CM

## 2022-10-02 DIAGNOSIS — J4520 Mild intermittent asthma, uncomplicated: Secondary | ICD-10-CM | POA: Diagnosis not present

## 2022-10-02 DIAGNOSIS — Z23 Encounter for immunization: Secondary | ICD-10-CM | POA: Diagnosis not present

## 2022-10-02 DIAGNOSIS — J069 Acute upper respiratory infection, unspecified: Secondary | ICD-10-CM | POA: Diagnosis not present

## 2022-10-02 DIAGNOSIS — Z6841 Body Mass Index (BMI) 40.0 and over, adult: Secondary | ICD-10-CM

## 2022-10-02 DIAGNOSIS — G4733 Obstructive sleep apnea (adult) (pediatric): Secondary | ICD-10-CM | POA: Diagnosis not present

## 2022-10-02 DIAGNOSIS — Z0001 Encounter for general adult medical examination with abnormal findings: Secondary | ICD-10-CM | POA: Diagnosis not present

## 2022-10-02 DIAGNOSIS — J4541 Moderate persistent asthma with (acute) exacerbation: Secondary | ICD-10-CM | POA: Diagnosis not present

## 2022-10-02 DIAGNOSIS — Z8042 Family history of malignant neoplasm of prostate: Secondary | ICD-10-CM | POA: Diagnosis not present

## 2022-10-02 LAB — BASIC METABOLIC PANEL
BUN: 18 mg/dL (ref 6–23)
CO2: 28 meq/L (ref 19–32)
Calcium: 9.4 mg/dL (ref 8.4–10.5)
Chloride: 102 meq/L (ref 96–112)
Creatinine, Ser: 1.01 mg/dL (ref 0.40–1.50)
GFR: 91.11 mL/min (ref >60.00)
Glucose, Bld: 75 mg/dL (ref 70–99)
Potassium: 4.4 meq/L (ref 3.5–5.1)
Sodium: 141 meq/L (ref 135–145)

## 2022-10-02 LAB — URINALYSIS, ROUTINE W REFLEX MICROSCOPIC
Bilirubin Urine: NEGATIVE
Hgb urine dipstick: NEGATIVE
Ketones, ur: NEGATIVE
Leukocytes,Ua: NEGATIVE
Nitrite: NEGATIVE
Specific Gravity, Urine: 1.025 (ref 1.000–1.030)
Total Protein, Urine: NEGATIVE
Urine Glucose: NEGATIVE
Urobilinogen, UA: 0.2 (ref 0.0–1.0)
pH: 6 (ref 5.0–8.0)

## 2022-10-02 LAB — HEPATIC FUNCTION PANEL
ALT: 47 U/L (ref 0–53)
AST: 19 U/L (ref 0–37)
Albumin: 4.5 g/dL (ref 3.5–5.2)
Alkaline Phosphatase: 64 U/L (ref 39–117)
Bilirubin, Direct: 0.1 mg/dL (ref 0.0–0.3)
Total Bilirubin: 0.7 mg/dL (ref 0.2–1.2)
Total Protein: 6.9 g/dL (ref 6.0–8.3)

## 2022-10-02 LAB — CBC WITH DIFFERENTIAL/PLATELET
Basophils Absolute: 0 10*3/uL (ref 0.0–0.1)
Basophils Relative: 0.3 % (ref 0.0–3.0)
Eosinophils Absolute: 0.3 10*3/uL (ref 0.0–0.7)
Eosinophils Relative: 2 % (ref 0.0–5.0)
HCT: 44.6 % (ref 39.0–52.0)
Hemoglobin: 14.5 g/dL (ref 13.0–17.0)
Lymphocytes Relative: 28.6 % (ref 12.0–46.0)
Lymphs Abs: 3.6 10*3/uL (ref 0.7–4.0)
MCHC: 32.5 g/dL (ref 30.0–36.0)
MCV: 89.4 fL (ref 78.0–100.0)
Monocytes Absolute: 1.1 10*3/uL — ABNORMAL HIGH (ref 0.1–1.0)
Monocytes Relative: 9 % (ref 3.0–12.0)
Neutro Abs: 7.5 10*3/uL (ref 1.4–7.7)
Neutrophils Relative %: 60.1 % (ref 43.0–77.0)
Platelets: 354 10*3/uL (ref 150.0–400.0)
RBC: 4.99 Mil/uL (ref 4.22–5.81)
RDW: 13.3 % (ref 11.5–15.5)
WBC: 12.5 10*3/uL — ABNORMAL HIGH (ref 4.0–10.5)

## 2022-10-02 LAB — TSH: TSH: 4.56 u[IU]/mL (ref 0.35–5.50)

## 2022-10-02 LAB — PSA: PSA: 0.85 ng/mL (ref 0.10–4.00)

## 2022-10-02 LAB — HEMOGLOBIN A1C: Hgb A1c MFr Bld: 5.7 % (ref 4.6–6.5)

## 2022-10-02 MED ORDER — AIRSUPRA 90-80 MCG/ACT IN AERO
2.0000 | INHALATION_SPRAY | Freq: Four times a day (QID) | RESPIRATORY_TRACT | 1 refills | Status: DC | PRN
Start: 2022-10-02 — End: 2023-04-01
  Filled 2022-10-02: qty 32.1, 45d supply, fill #0

## 2022-10-02 MED ORDER — BUDESONIDE-FORMOTEROL FUMARATE 160-4.5 MCG/ACT IN AERO
2.0000 | INHALATION_SPRAY | Freq: Two times a day (BID) | RESPIRATORY_TRACT | 1 refills | Status: DC
Start: 2022-10-02 — End: 2023-04-01
  Filled 2022-10-02: qty 30.6, 90d supply, fill #0

## 2022-10-02 MED ORDER — HYDROCODONE BIT-HOMATROP MBR 5-1.5 MG/5ML PO SOLN
5.0000 mL | Freq: Three times a day (TID) | ORAL | 0 refills | Status: AC | PRN
Start: 2022-10-02 — End: 2022-10-10
  Filled 2022-10-02: qty 120, 8d supply, fill #0

## 2022-10-02 MED ORDER — METHYLPREDNISOLONE 4 MG PO TBPK
ORAL_TABLET | ORAL | 0 refills | Status: AC
Start: 2022-10-02 — End: 2022-10-08
  Filled 2022-10-02: qty 21, 6d supply, fill #0

## 2022-10-02 NOTE — Progress Notes (Unsigned)
Subjective:  Patient ID: Adam Dunn, male    DOB: [birth year]  Age: 42 y.o. MRN: [id number]  CC: Annual Exam, Hypertension, Hyperlipidemia, and Asthma   HPI Shaw[place] P Stanek presents for a CPX and f/up --  Discussed the use of AI scribe software for clinical note transcription with the patient, who gave verbal consent to proceed.  History of Present Illness   The patient, with a history of recurrent bronchitis, presents with a dry cough of five days duration. The cough is described as similar to previous episodes of bronchitis, typically triggered by a cold. The cough is non-productive, with no associated phlegm, fever, chills, or night sweats. The patient does report occasional wheezing at the end of the cough. The symptoms are particularly troublesome at night. The patient has been self-managing with an unspecified dose of Symbicort inhaler. There is no reported testing for COVID or influenza. The patient reports that others in the household have been unwell with cold-like symptoms, suggesting a possible infectious etiology.       Outpatient Medications Prior to Visit  Medication Sig Dispense Refill   buPROPion (WELLBUTRIN XL) 300 MG 24 hr tablet Take 1 tablet (300 mg total) by mouth daily. 90 tablet 1   Cholecalciferol 2000 units TABS Take 1 tablet (2,000 Units total) by mouth daily. 90 tablet 1   eletriptan (RELPAX) 40 MG tablet Take 1 tablet (40 mg total) by mouth as needed for migraine or headache. May repeat in 2 hours if headache persists or recurs. 10 tablet 0   fluticasone (FLONASE) 50 MCG/ACT nasal spray Place 2 sprays into both nostrils daily. 16 g 0   hydrOXYzine (VISTARIL) 25 MG capsule Take 1 capsule (25 mg total) by mouth in the morning. 45 capsule 1   omeprazole (PRILOSEC) 20 MG capsule Take 1 capsule (20 mg total) by mouth daily as needed. 90 capsule 1   albuterol (VENTOLIN HFA) 108 (90 Base) MCG/ACT inhaler Inhale 2 puffs into the lungs every 6 (six) hours as needed for  wheezing or shortness of breath. 6.7 g 2   fluticasone-salmeterol (ADVAIR DISKUS) 250-50 MCG/ACT AEPB Inhale 1 puff into the lungs in the morning and at bedtime. 120 each 1   irbesartan (AVAPRO) 300 MG tablet Take 1 tablet (300 mg total) by mouth daily. 90 tablet 1   ondansetron (ZOFRAN) 4 MG tablet Take 1 tablet (4 mg total) by mouth every 8 (eight) hours as needed for nausea or vomiting. 65 tablet 1   predniSONE (DELTASONE) 20 MG tablet Take 2 tablets (40 mg total) by mouth daily with breakfast. 10 tablet 0   propranolol (INDERAL) 20 MG tablet Take 1 tablet (20 mg total) by mouth 3 (three) times daily. 270 tablet 1   rizatriptan (MAXALT) 10 MG tablet Take 1 tablet (10 mg total) by mouth as needed for migraine. May repeat in 2 hours if needed 10 tablet 0   No facility-administered medications prior to visit.    ROS Review of Systems  Constitutional:  Positive for unexpected weight change (wt gain). Negative for appetite change, chills, diaphoresis, fatigue and fever.  HENT: Negative.  Negative for sinus pressure, sore throat and trouble swallowing.   Eyes: Negative.   Respiratory:  Positive for apnea, cough and wheezing. Negative for chest tightness and shortness of breath.   Cardiovascular:  Negative for chest pain, palpitations and leg swelling.  Gastrointestinal:  Negative for abdominal pain, constipation, diarrhea, nausea and vomiting.  Endocrine: Negative.   Genitourinary: Negative.  Negative  for difficulty urinating.  Musculoskeletal:  Positive for arthralgias. Negative for myalgias.  Skin: Negative.   Neurological:  Negative for dizziness, weakness and headaches.  Hematological:  Negative for adenopathy. Does not bruise/bleed easily.  Psychiatric/Behavioral: Negative.      Objective:  BP (!) 136/94 (BP Location: Left Arm, Patient Position: Sitting, Cuff Size: Large)   Pulse 84   Temp 98.2 F (36.8 C) (Oral)   Resp 16   Ht 5\' 6"  (1.676 m)   Wt 297 lb (134.7 kg)   SpO2 94%    BMI 47.94 kg/m   BP Readings from Last 3 Encounters:  10/02/22 (!) 136/94  12/25/21 130/82  06/16/21 120/72    Wt Readings from Last 3 Encounters:  10/02/22 297 lb (134.7 kg)  12/25/21 273 lb 12.8 oz (124.2 kg)  06/16/21 275 lb (124.7 kg)    Physical Exam Vitals reviewed.  Constitutional:      General: He is not in acute distress.    Appearance: Normal appearance. He is obese. He is not ill-appearing, toxic-appearing or diaphoretic.  HENT:     Mouth/Throat:     Mouth: Mucous membranes are moist.  Eyes:     General: No scleral icterus.    Conjunctiva/sclera: Conjunctivae normal.  Cardiovascular:     Rate and Rhythm: Normal rate and regular rhythm.     Heart sounds: No murmur heard.    No friction rub. No gallop.     Comments: EKG- NSR, 73 bpm No LVH, Q waves, or ST/T waves  Pulmonary:     Effort: Pulmonary effort is normal.     Breath sounds: No stridor. No wheezing, rhonchi or rales.  Abdominal:     General: Abdomen is protuberant. Bowel sounds are normal. There is no distension.     Palpations: Abdomen is soft. There is no hepatomegaly, splenomegaly or mass.     Tenderness: There is no abdominal tenderness. There is no guarding.  Musculoskeletal:        General: Normal range of motion.     Cervical back: Neck supple.     Right lower leg: No edema.     Left lower leg: No edema.  Skin:    General: Skin is warm and dry.     Coloration: Skin is not pale.  Neurological:     General: No focal deficit present.     Mental Status: He is alert. Mental status is at baseline.  Psychiatric:        Mood and Affect: Mood normal.        Behavior: Behavior normal.     Lab Results  Component Value Date   WBC 12.5 (H) 10/02/2022   HGB 14.5 10/02/2022   HCT 44.6 10/02/2022   PLT 354.0 10/02/2022   GLUCOSE 75 10/02/2022   CHOL 167 06/17/2020   TRIG 112.0 06/17/2020   HDL 44.20 06/17/2020   LDLCALC 100 (H) 06/17/2020   ALT 47 10/02/2022   AST 19 10/02/2022   NA 141  10/02/2022   K 4.4 10/02/2022   CL 102 10/02/2022   CREATININE 1.01 10/02/2022   BUN 18 10/02/2022   CO2 28 10/02/2022   TSH 4.56 10/02/2022   PSA 0.85 10/02/2022   INR 1.0 06/17/2020   HGBA1C 5.7 10/02/2022    DG Foot Complete Right  Result Date: 11/23/2017 Please see detailed radiograph report in office note.  DG Foot Complete Left  Result Date: 11/23/2017 Please see detailed radiograph report in office note.  US Abdomen Limited RUQ  Result Date: 11/22/2017 CLINICAL DATA:  Elevated liver enzymes EXAM: ULTRASOUND ABDOMEN LIMITED RIGHT UPPER QUADRANT COMPARISON:  None. FINDINGS: Gallbladder: No gallstones or wall thickening visualized. There is no pericholecystic fluid. No sonographic Murphy sign noted by sonographer. Common bile duct: Diameter: 3 mm. No intrahepatic or extrahepatic biliary duct dilatation. Liver: No focal lesion identified. Liver echogenicity is increased diffusely. Portal vein is patent on color Doppler imaging with normal direction of blood flow towards the liver. IMPRESSION: Diffuse increase in liver echogenicity, a finding felt to be indicative of hepatic steatosis. While no focal liver lesions are evident on this study, it must be cautioned that the sensitivity of ultrasound for detection of focal liver lesions is diminished in this circumstance. Study otherwise unremarkable. Electronically Signed   By: Bretta Bang III M.D.   On: 11/22/2017 11:46    Assessment & Plan:  Essential hypertension- He has not achieved his blood pressure goal of 130/80.  EKG is negative for LVH.  Labs are negative for secondary causes or endorgan damage.  Will continue the ARB and he will continue working on his lifestyle modifications. -     Basic metabolic panel; Future -     CBC with Differential/Platelet; Future -     TSH; Future -     EKG 12-Lead -     Urinalysis, Routine w reflex microscopic; Future -     Hepatic function panel; Future -     Irbesartan; Take 1 tablet  (300 mg total) by mouth daily.  Dispense: 90 tablet; Refill: 1  Moderate persistent asthma with acute exacerbation -     methylPREDNISolone; TAKE AS DIRECTED  Dispense: 21 tablet; Refill: 0  Mild intermittent asthma without complication -     Budesonide-Formoterol Fumarate; Inhale 2 puffs into the lungs 2 (two) times daily.  Dispense: 30.6 g; Refill: 1 -     Airsupra; Inhale 2 puffs into the lungs 4 (four) times daily as needed.  Dispense: 32.1 g; Refill: 1  Class 3 severe obesity due to excess calories with serious comorbidity and body mass index (BMI) of 45.0 to 49.9 in adult [facility])- Labs are negative for secondary causes or complications. -     Hemoglobin A1c; Future -     Hepatic function panel; Future  Encounter for general adult medical examination with abnormal findings - Exam completed, labs reviewed, vaccines reviewed and updated, cancer screenings addressed, pt ed material was given.  -     PSA; Future  Viral URI with cough -     HYDROcodone Bit-Homatrop MBr; Take 5 mLs by mouth every 8 (eight) hours as needed for up to 8 days for cough.  Dispense: 120 mL; Refill: 0  Flu vaccine need -     Flu vaccine trivalent PF, 6mos and older(Flulaval,Afluria,Fluarix,Fluzone)  Family history of prostate cancer in father -     PSA; Future  OSA on CPAP -     Ambulatory referral to Sleep Studies     Follow-up: Return in about 6 months (around 04/01/2023).  Sanda Linger, MD

## 2022-10-02 NOTE — Patient Instructions (Signed)
Health Maintenance, Male Adopting a healthy lifestyle and getting preventive care are important in promoting health and wellness. Ask your health care provider about: The right schedule for you to have regular tests and exams. Things you can do on your own to prevent diseases and keep yourself healthy. What should I know about diet, weight, and exercise? Eat a healthy diet  Eat a diet that includes plenty of vegetables, fruits, low-fat dairy products, and lean protein. Do not eat a lot of foods that are high in solid fats, added sugars, or sodium. Maintain a healthy weight Body mass index (BMI) is a measurement that can be used to identify possible weight problems. It estimates body fat based on height and weight. Your health care provider can help determine your BMI and help you achieve or maintain a healthy weight. Get regular exercise Get regular exercise. This is one of the most important things you can do for your health. Most adults should: Exercise for at least 150 minutes each week. The exercise should increase your heart rate and make you sweat (moderate-intensity exercise). Do strengthening exercises at least twice a week. This is in addition to the moderate-intensity exercise. Spend less time sitting. Even light physical activity can be beneficial. Watch cholesterol and blood lipids Have your blood tested for lipids and cholesterol at 43 years of age, then have this test every 5 years. You may need to have your cholesterol levels checked more often if: Your lipid or cholesterol levels are high. You are older than 43 years of age. You are at high risk for heart disease. What should I know about cancer screening? Many types of cancers can be detected early and may often be prevented. Depending on your health history and family history, you may need to have cancer screening at various ages. This may include screening for: Colorectal cancer. Prostate cancer. Skin cancer. Lung  cancer. What should I know about heart disease, diabetes, and high blood pressure? Blood pressure and heart disease High blood pressure causes heart disease and increases the risk of stroke. This is more likely to develop in people who have high blood pressure readings or are overweight. Talk with your health care provider about your target blood pressure readings. Have your blood pressure checked: Every 3-5 years if you are 18-39 years of age. Every year if you are 40 years old or older. If you are between the ages of 65 and 75 and are a current or former smoker, ask your health care provider if you should have a one-time screening for abdominal aortic aneurysm (AAA). Diabetes Have regular diabetes screenings. This checks your fasting blood sugar level. Have the screening done: Once every three years after age 45 if you are at a normal weight and have a low risk for diabetes. More often and at a younger age if you are overweight or have a high risk for diabetes. What should I know about preventing infection? Hepatitis B If you have a higher risk for hepatitis B, you should be screened for this virus. Talk with your health care provider to find out if you are at risk for hepatitis B infection. Hepatitis C Blood testing is recommended for: Everyone born from 1945 through 1965. Anyone with known risk factors for hepatitis C. Sexually transmitted infections (STIs) You should be screened each year for STIs, including gonorrhea and chlamydia, if: You are sexually active and are younger than 43 years of age. You are older than 43 years of age and your   health care provider tells you that you are at risk for this type of infection. Your sexual activity has changed since you were last screened, and you are at increased risk for chlamydia or gonorrhea. Ask your health care provider if you are at risk. Ask your health care provider about whether you are at high risk for HIV. Your health care provider  may recommend a prescription medicine to help prevent HIV infection. If you choose to take medicine to prevent HIV, you should first get tested for HIV. You should then be tested every 3 months for as long as you are taking the medicine. Follow these instructions at home: Alcohol use Do not drink alcohol if your health care provider tells you not to drink. If you drink alcohol: Limit how much you have to 0-2 drinks a day. Know how much alcohol is in your drink. In the [place] one drink equals one 12 oz bottle of beer (355 mL), one 5 oz glass of wine (148 mL), or one 1 oz glass of hard liquor (44 mL). Lifestyle Do not use any products that contain nicotine or tobacco. These products include cigarettes, chewing tobacco, and vaping devices, such as e-cigarettes. If you need help quitting, ask your health care provider. Do not use street drugs. Do not share needles. Ask your health care provider for help if you need support or information about quitting drugs. General instructions Schedule regular health, dental, and eye exams. Stay current with your vaccines. Tell your health care provider if: You often feel depressed. You have ever been abused or do not feel safe at home. Summary Adopting a healthy lifestyle and getting preventive care are important in promoting health and wellness. Follow your health care provider's instructions about healthy diet, exercising, and getting tested or screened for diseases. Follow your health care provider's instructions on monitoring your cholesterol and blood pressure. This information is not intended to replace advice given to you by your health care provider. Make sure you discuss any questions you have with your health care provider. Document Revised: 05/13/2020 Document Reviewed: 05/13/2020 Elsevier Patient Education  2024 [employer]  

## 2022-10-03 ENCOUNTER — Other Ambulatory Visit (HOSPITAL_COMMUNITY): Payer: Self-pay

## 2022-10-03 MED ORDER — IRBESARTAN 300 MG PO TABS
300.0000 mg | ORAL_TABLET | Freq: Every day | ORAL | 1 refills | Status: DC
Start: 2022-10-03 — End: 2023-04-02
  Filled 2022-10-03: qty 90, 90d supply, fill #0
  Filled 2023-01-29 (×2): qty 90, 90d supply, fill #1

## 2022-10-05 ENCOUNTER — Other Ambulatory Visit (HOSPITAL_COMMUNITY): Payer: Self-pay

## 2022-10-10 ENCOUNTER — Telehealth: Payer: 59 | Admitting: Physician Assistant

## 2022-10-10 DIAGNOSIS — J4541 Moderate persistent asthma with (acute) exacerbation: Secondary | ICD-10-CM | POA: Diagnosis not present

## 2022-10-11 MED ORDER — AZITHROMYCIN 250 MG PO TABS
ORAL_TABLET | ORAL | 0 refills | Status: AC
Start: 2022-10-11 — End: 2022-10-16

## 2022-10-11 MED ORDER — BENZONATATE 100 MG PO CAPS
100.0000 mg | ORAL_CAPSULE | Freq: Three times a day (TID) | ORAL | 0 refills | Status: DC | PRN
Start: 2022-10-11 — End: 2022-10-14

## 2022-10-11 NOTE — Progress Notes (Signed)

## 2022-10-13 ENCOUNTER — Other Ambulatory Visit: Payer: Self-pay | Admitting: Internal Medicine

## 2022-10-13 ENCOUNTER — Telehealth: Payer: Self-pay | Admitting: Internal Medicine

## 2022-10-13 ENCOUNTER — Ambulatory Visit (INDEPENDENT_AMBULATORY_CARE_PROVIDER_SITE_OTHER): Payer: 59

## 2022-10-13 ENCOUNTER — Encounter: Payer: Self-pay | Admitting: Internal Medicine

## 2022-10-13 DIAGNOSIS — J40 Bronchitis, not specified as acute or chronic: Secondary | ICD-10-CM | POA: Diagnosis not present

## 2022-10-13 DIAGNOSIS — R052 Subacute cough: Secondary | ICD-10-CM

## 2022-10-13 DIAGNOSIS — R059 Cough, unspecified: Secondary | ICD-10-CM | POA: Diagnosis not present

## 2022-10-13 NOTE — Telephone Encounter (Signed)
Patient is calling about chest xray that Dr. Yetta Barre wants him to have - but so far no orders - please call patient - [phone]

## 2022-10-14 ENCOUNTER — Other Ambulatory Visit: Payer: Self-pay | Admitting: Internal Medicine

## 2022-10-14 ENCOUNTER — Other Ambulatory Visit (HOSPITAL_COMMUNITY): Payer: Self-pay

## 2022-10-14 DIAGNOSIS — J4541 Moderate persistent asthma with (acute) exacerbation: Secondary | ICD-10-CM

## 2022-10-14 MED ORDER — BENZONATATE 100 MG PO CAPS
100.0000 mg | ORAL_CAPSULE | Freq: Three times a day (TID) | ORAL | 0 refills | Status: DC | PRN
  Filled 2022-10-14 – 2023-01-29 (×2): qty 30, 5d supply, fill #0

## 2022-11-02 ENCOUNTER — Ambulatory Visit: Payer: 59 | Admitting: Neurology

## 2022-11-02 ENCOUNTER — Encounter: Payer: Self-pay | Admitting: Neurology

## 2022-11-02 VITALS — BP 131/91 | HR 75 | Ht 66.0 in | Wt 299.0 lb

## 2022-11-02 DIAGNOSIS — Z6841 Body Mass Index (BMI) 40.0 and over, adult: Secondary | ICD-10-CM | POA: Diagnosis not present

## 2022-11-02 DIAGNOSIS — G4733 Obstructive sleep apnea (adult) (pediatric): Secondary | ICD-10-CM | POA: Diagnosis not present

## 2022-11-02 DIAGNOSIS — R635 Abnormal weight gain: Secondary | ICD-10-CM

## 2022-11-02 NOTE — Patient Instructions (Addendum)
It was nice to see you again today.   As discussed, we will proceed with a home sleep test (HST) to re-establish your sleep apnea diagnosis and to get you a new machine. Our sleep lab staff will reach out to you to arrange for pickup and for tutorial of your test equipment - you will do the test at home that night and bring the test sensors back for data analysis the next day or whenever you are scheduled for drop off of your test equipment. I will write for a new machine after your HST confirms your obstructive sleep apnea diagnosis.   Please remember, you will not use your CPAP the night of your testing.  This is so we get diagnostic data, we do not need treatment data.    Most people who are very consistent with their AutoPap or CPAP use do not sleep very well without the machine - this is understandable, but as long as we have just enough sleep data that confirms your sleep apnea diagnosis we should be good.  You may want to take a nap after your test is completed and use your CPAP machine for your nap after testing.  We will schedule a follow-up appointment after set up with your new machine, typically within 31 to 89 days post treatment start. You will need to show compliance with usage and fulfill a minimum usage percentage (this is an insurance requirement).  After you have done your home sleep test, you can resume using your current machine until you get a new one.   Please continue using your CPAP regularly. While your insurance requires that you use CPAP at least 4 hours each night on 70% of the nights, I recommend, that you not skip any nights and use it throughout the night if you can. Getting used to CPAP and staying with the treatment long term does take time and patience and discipline. Untreated obstructive sleep apnea when it is moderate to severe can have an adverse impact on cardiovascular health and raise her risk for heart disease, arrhythmias, hypertension, congestive heart failure,  stroke and diabetes. Untreated obstructive sleep apnea causes sleep disruption, nonrestorative sleep, and sleep deprivation. This can have an impact on your day to day functioning and cause daytime sleepiness and impairment of cognitive function, memory loss, mood disturbance, and problems focussing. Using CPAP regularly can improve these symptoms.

## 2022-11-02 NOTE — Progress Notes (Signed)
Subjective:    Patient ID: Adam Dunn is a 42 y.o. male.  HPI    Adam Foley, MD, PhD [facility] [address] [address] [place], [place] [address]  Dear Dr. Yetta Barre,   I saw your patient, Adam Dunn, upon your kind request in my sleep clinic today for evaluation of his obstructive sleep apnea.  The patient is unaccompanied today.  As you know, Adam Dunn is a 42 year old male with an underlying medical history of hypertension, migraine headaches, intermittent asthma, and severe obesity with a BMI of over 45, who was previously diagnosed with obstructive sleep apnea and placed on PAP therapy.  He reports that his CPAP machine has been working well but has been making a louder noise, he has not seen an error message on the display but does not typically look at the display.  It seems to be working fine but he would like to get a new machine.   He has had weight fluctuation and with time has increased his CPAP settings himself.  He has had significant weight gain compared to his last visit in this office about 5 years ago.  He is currently working on weight loss.  His Epworth sleepiness score is 6 out of 24, fatigue severity score is 28 out of 63.  His DME provider is [employer].  He reports compliance with his current machine.  His set up on CPAP therapy was in February 2018.  He has not been followed in our sleep clinic in over 5 years.  I first evaluated him in 2017 at the request of Dr. Marjory Dunn.  He had a baseline sleep study on 01/02/2016 which showed an AHI of 10/h, REM AHI was in the severe range, O2 nadir 70%.  He had a subsequent titration study normal on 02/12/2016.  His original CPAP pressure was 6 cm and he increased it over time, current pressure of 10.8 cm.  He uses nasal pillows.  His wife had noticed a residual or recurrence of snoring which prompted him to increase the pressure settings.  I was able to review his compliance data from the past 90  days, he used his CPAP every night with percent use days greater than 4 hours at 99%, indicating excellent compliance with an average usage of 7 hours and 45 minutes, residual AHI currently at goal at 1.2/h, leak on the low side with the 95th percentile at 4.6 L/min on a pressure of 10.8 cm with EPR of 3.  He lives with his family including wife and 3 children.  He works as a Scientist, research (physical sciences).  He drinks caffeine in the form of diet soda, about 2 to 3 cans/day.  He drinks alcohol very occasionally, once or twice a month, he is a non-smoker.  They have 1 dog in the household.  He denies recurrent nocturnal or morning headaches.  He does not have nightly nocturia.  Bedtime is generally between 11 and midnight and rise time between 6:45 AM and 7 AM.      Previously (copied from previous notes for reference):   05/19/17: 42 year old gentleman with an underlying medical history of migraine headaches, paresthesias, vitamin D deficiency, anxiety, reflux disease and morbid obesity, who presents for follow-up consultation of his obstructive sleep apnea, on CPAP therapy. The patient is unaccompanied today. I last saw him on 05/12/2016, at which time we talked about his two sleep studies. He had started CPAP therapy and felt improved particularly with regards to  his headaches and even his left arm paresthesias. He was fully compliant with CPAP therapy and advised to follow-up in one year routinely.    I reviewed his CPAP compliance data from 04/18/2017 through 05/17/2017 which is a total of 30 days, during which time he used his CPAP every night with percent used days greater than 4 hours at 100%, indicating superb compliance with an average usage of 7 hours and 21 minutes, residual AHI at goal at 0.8 per hour, leak on the low side with the 95th percentile at 4 L/m at a pressure of 6 cm with EPR of 3. He reports doing well. Continues to benefit from his CPAP. He may need new supplies. His DME company is [employer]. He  uses nasal pillows. He has had ongoing benefit with regards to his headaches as well. He does not have morning headaches nearly as frequently as he used to. He uses Relpax as needed generic. He has not had to see Dr. Marjory Dunn.    I first met him on 12/10/2015 at the request of Dr. Marjory Dunn, at which time the patient reported snoring and excessive daytime somnolence as well as recurrent morning headaches. I invited him for sleep study testing. He had a baseline sleep study, followed by a CPAP titration study. I went over his test results with him in detail today. He had a baseline sleep study on 01/02/2016 which showed a sleep efficiency of 86.1%, sleep latency was 14 minutes, REM latency was 74.5 minutes, he had a increased percentage of stage III sleep, REM sleep was 15.3%, mild snoring was noted. EKG showed rare PVCs. Total AHI was 10 per hour, REM AHI was 37.6 per hour, supine AHI was 8.3 per hour. Average oxygen saturation was 96%, nadir was 70% and supine REM sleep. He had no significant PLMS. Based on his medical history and sleep related complaints as well as well as significant desaturations during REM sleep he was invited for a full night CPAP titration study. He had this on 02/12/2016. Sleep efficiency was 78.1%, sleep latency was prolonged at 99 minutes, REM latency borderline at 119.5 minutes. He had an increased percentage of stage III sleep, REM sleep was reduced at 10.1%. CPAP was titrated from 5 cm to 6 cm. AHI was 0 per hour on the final pressure with supine REM sleep achieved and O2 nadir of 89%. Based on his test results I prescribed CPAP therapy for home use.   I reviewed his CPAP compliance data from 04/11/2016 through 05/10/2016, which is a total of 30 days, during which time he used CPAP every night with percent used days greater than 4 hours at 100%, indicating superb compliance with an average usage of 7 hours and 42 minutes, residual AHI 0.9 per hour, leak low with the 95th percentile  at 1.1 L/m and a pressure of 6 cm with EPR.    12/10/2015: He reports snoring and excessive daytime somnolence as well as recurrent morning headaches. His Epworth sleepiness score is 8 out of 24 today, his fatigue score is 26 out of 63. I reviewed your office note from 11/13/2015. The patient is married and lives with his wife and 3 children. He works full-time. He is a nonsmoker. He drinks alcohol occasionally. He drinks caffeine daily in the form of coffee or soda, up to 3 per day. He states he had a sleep study some 6 years ago which per his report was borderline. I reviewed his home sleep test report from 11/06/2009, which showed  an AHI of 5 per hour, O2 nadir was 67%, mean in an effort, time below 90% saturation was 35 minutes for the night. Total testing time was 13 hours and 5 minutes which is unusual. He has gained about 15 pounds since his home sleep test some 6 years ago and weight has been fluctuating quite a bit as he can lose weight when he really tries hard and has lost down to the mid 170s one time in the past 5 years. He has 3 young children, ages 32, 14 and 1 yo. Typical bedtime is around midnight, wakeup time around 7- 7:15 AM. He has occasional morning headaches. His migraines often happen in the first part of the day and start at the base of his head or posterior neck area. He had a recent brain MRI which showed benign findings. He has had intermittent paresthesias in the upper extremities, left more than right. He does have intermittent restless leg symptoms, is not sure if he twitches or kicks his legs in his sleep. He does report sleep disruption, he denies nocturia.     His Past Medical History Is Significant For: Past Medical History:  Diagnosis Date   Migraines     His Past Surgical History Is Significant For: Past Surgical History:  Procedure Laterality Date   HERNIA REPAIR     infant    His Family History Is Significant For: Family History  Problem Relation Age of  Onset   Hypertension Mother    Migraines Mother    Fibromyalgia Mother    Arthritis Mother    Healthy Father    Healthy Brother    Coronary artery disease Maternal Grandfather    Emphysema Maternal Grandfather    Colon cancer Neg Hx    Prostate cancer Neg Hx    Sleep apnea Neg Hx     His Social History Is Significant For: Social History   Socioeconomic History   Marital status: Married    Spouse name: Shanda Bumps   Number of children: 3   Years of education: 16   Highest education level: Bachelor's degree (e.g., BA, AB, BS)  Occupational History   Occupation: Realtor    Comment: self employed  Tobacco Use   Smoking status: Never   Smokeless tobacco: Never   Tobacco comments:    11/13/15 very very occas cigar  Substance and Sexual Activity   Alcohol use: Yes    Comment: 2 drinks monthly   Drug use: No   Sexual activity: Not on file  Other Topics Concern   Not on file  Social History Narrative   Lives at home with wife, children   Caffeine- 2-3 daily-coffee, soda   Social Determinants of Health   Financial Resource Strain: Low Risk  (09/28/2022)   Overall Financial Resource Strain (CARDIA)    Difficulty of Paying Living Expenses: Not very hard  Food Insecurity: No Food Insecurity (09/28/2022)   Hunger Vital Sign    Worried About Running Out of Food in the Last Year: Never true    Ran Out of Food in the Last Year: Never true  Transportation Needs: No Transportation Needs (09/28/2022)   PRAPARE - Administrator, Civil Service (Medical): No    Lack of Transportation (Non-Medical): No  Physical Activity: Insufficiently Active (09/28/2022)   Exercise Vital Sign    Days of Exercise per Week: 2 days    Minutes of Exercise per Session: 20 min  Stress: Stress Concern Present (09/28/2022)   [employer]  of Occupational Health - Occupational Stress Questionnaire    Feeling of Stress : To some extent  Social Connections: Socially Integrated (09/28/2022)   Social  Connection and Isolation Panel [NHANES]    Frequency of Communication with Friends and Family: More than three times a week    Frequency of Social Gatherings with Friends and Family: Three times a week    Attends Religious Services: More than 4 times per year    Active Member of Clubs or Organizations: Yes    Attends Banker Meetings: 1 to 4 times per year    Marital Status: Married    His Allergies Are:  Allergies  Allergen Reactions   Sulfamethoxazole-Trimethoprim Other (See Comments)    REACTION: Pt does not remember, was an infant  :   His Current Medications Are:  Outpatient Encounter Medications as of 11/02/2022  Medication Sig   Albuterol-Budesonide (AIRSUPRA) 90-80 MCG/ACT AERO Inhale 2 puffs into the lungs 4 (four) times daily as needed. (Patient taking differently: Inhale 2 puffs into the lungs as needed.)   budesonide-formoterol (SYMBICORT) 160-4.5 MCG/ACT inhaler Inhale 2 puffs into the lungs 2 (two) times daily.   buPROPion (WELLBUTRIN XL) 300 MG 24 hr tablet Take 1 tablet (300 mg total) by mouth daily.   Cholecalciferol 2000 units TABS Take 1 tablet (2,000 Units total) by mouth daily.   eletriptan (RELPAX) 40 MG tablet Take 1 tablet (40 mg total) by mouth as needed for migraine or headache. May repeat in 2 hours if headache persists or recurs.   fluticasone (FLONASE) 50 MCG/ACT nasal spray Place 2 sprays into both nostrils daily.   hydrOXYzine (VISTARIL) 25 MG capsule Take 1 capsule (25 mg total) by mouth in the morning.   irbesartan (AVAPRO) 300 MG tablet Take 1 tablet (300 mg total) by mouth daily.   omeprazole (PRILOSEC) 20 MG capsule Take 1 capsule (20 mg total) by mouth daily as needed.   benzonatate (TESSALON) 100 MG capsule Take 1-2 capsules (100-200 mg total) by mouth 3 (three) times daily as needed.   No facility-administered encounter medications on file as of 11/02/2022.  :   Review of Systems:  Out of a complete 14 point review of systems, all  are reviewed and negative with the exception of these symptoms as listed below:  Review of Systems  Neurological:        Pt here for sleep consult  Pt states wear pap machine 4+ hours nightly Pt states pap machine making noise at night  Set up date 02/2016   ESS:6 FSS:28    Objective:  Neurological Exam  Physical Exam Physical Examination:   Vitals:   11/02/22 1114  BP: (!) 131/91  Pulse: 75    General Examination: The patient is a very pleasant 42 y.o. male in no acute distress. He appears well-developed and well-nourished and well groomed.   HEENT: Normocephalic, atraumatic, pupils are equal, round and reactive to light, extraocular tracking is good without limitation to gaze excursion or nystagmus noted. Hearing is grossly intact. Face is symmetric with normal facial animation. Speech is clear with no dysarthria noted. There is no hypophonia. There is no lip, neck/head, jaw or voice tremor. Neck is supple with full range of passive and active motion. There are no carotid bruits on auscultation. Oropharynx exam reveals: No significant mouth dryness, good dental hygiene, moderate airway crowding secondary to small airway entry and prominent uvula, smaller tonsils, neck circumference 18 and three-quarter inches.  Mild overbite noted.  Tongue protrudes  centrally and palate elevates symmetrically.   Chest: Clear to auscultation without wheezing, rhonchi or crackles noted.  Heart: S1+S2+0, regular and normal without murmurs, rubs or gallops noted.   Abdomen: Soft, non-tender and non-distended.  Extremities: There is trace pitting edema in the distal lower extremities bilaterally.   Skin: Warm and dry without trophic changes noted.   Musculoskeletal: exam reveals no obvious joint deformities.   Neurologically:  Mental status: The patient is awake, alert and oriented in all 4 spheres. His immediate and remote memory, attention, language skills and fund of knowledge are appropriate.  There is no evidence of aphasia, agnosia, apraxia or anomia. Speech is clear with normal prosody and enunciation. Thought process is linear. Mood is normal and affect is normal.  Cranial nerves II - XII are as described above under HEENT exam.  Motor exam: Normal bulk, strength and tone is noted. There is no obvious action or resting tremor.  Fine motor skills and coordination: grossly intact.  Cerebellar testing: No dysmetria or intention tremor. There is no truncal or gait ataxia.  Sensory exam: intact to light touch in the upper and lower extremities.  Gait, station and balance: He stands easily. No veering to one side is noted. No leaning to one side is noted. Posture is age-appropriate and stance is narrow based. Gait shows normal stride length and normal pace. No problems turning are noted.   Assessment and Plan:  In summary, NENO ASKIN is a very pleasant 42 y.o.-year old male with an underlying medical history of hypertension, migraine headaches, intermittent asthma, and severe obesity with a BMI of over 45, who presents for evaluation of his obstructive sleep apnea.  He has been consistent with CPAP therapy for years.  He has had interim weight fluctuation.  He should be eligible for new CPAP machine.  He has done well with PAP therapy and we mutually agreed to continue with CPAP therapy for his sleep apnea treatment.  Weight increase may have led to an increase in severity of sleep apnea in his case.  He has increased his pressure over time.  He is currently working on weight loss.  I suggested we proceed with a home sleep test for reassessment.  He is reminded not to use his CPAP the night of testing.  I will plan to write for a new CPAP or AutoPap machine after testing and we will plan a follow-up in the sleep clinic after his set up on new equipment.  He is commended for his treatment adherence and advised to continue with his CPAP therapy consistently with the current machine.  I answered  all his questions today and he was in agreement with our plan.  Thank you very much for allowing me to participate in the care of this nice patient. If I can be of any further assistance to you please do not hesitate to call me at [phone].  Sincerely,   Adam Foley, MD, PhD

## 2022-11-17 ENCOUNTER — Ambulatory Visit: Payer: 59 | Admitting: Neurology

## 2022-11-17 DIAGNOSIS — G4733 Obstructive sleep apnea (adult) (pediatric): Secondary | ICD-10-CM

## 2022-11-17 DIAGNOSIS — R635 Abnormal weight gain: Secondary | ICD-10-CM

## 2022-11-26 NOTE — Progress Notes (Signed)
See procedure note.

## 2022-11-30 NOTE — Procedures (Signed)
[facility]  HOME SLEEP TEST (Watch PAT) REPORT -  Mail-out Device  STUDY DATE: 11/22/2022  DOB: December 23, 1980  MRN: [id number]  ORDERING CLINICIAN: [name], MD, PhD   REFERRING CLINICIAN: Etta Grandchild, MD   CLINICAL INFORMATION/HISTORY: 42 year old male with an underlying medical history of hypertension, migraine headaches, intermittent asthma, and severe obesity with a BMI of over 45, who presents for reevaluation of his obstructive sleep apnea.  He has been on CPAP therapy, he has been compliant with treatment and should qualify for new machine.  He has not had sleep testing in several years.  His weight has been fluctuating.  He is currently on a CPAP of 10.8 cm with EPR of 3.  Epworth sleepiness score: 6/24.  BMI: 48.3 kg/m  FINDINGS:   Sleep Summary:   Total Recording Time (hours, min): 8 hours, 36 min  Total Sleep Time (hours, min):  7 hours, 36 min  Percent REM (%):    21.5%   Respiratory Indices:   Calculated pAHI (per hour):  25.9/hour         REM pAHI:    38.2/hour       NREM pAHI: 22.7/hour  Central pAHI: 0.6/hour  Oxygen Saturation Statistics:    Oxygen Saturation (%) Mean: 95%   Minimum oxygen saturation (%):                 80%   O2 Saturation Range (%): 80 - 100%    O2 Saturation (minutes) <=88%: 2.6 min  Pulse Rate Statistics:   Pulse Mean (bpm):    72/min    Pulse Range (49- 115/min)   IMPRESSION: OSA (obstructive sleep apnea), moderate  RECOMMENDATION:  This home sleep test demonstrates moderate obstructive sleep apnea with a total AHI of 25.9/hour and O2 nadir of 80%.  Moderate snoring was detected fairly consistently throughout the night, at times louder, at times a little milder.  Ongoing treatment with a positive airway pressure (PAP) device is recommended. The patient has been compliant with CPAP therapy and should qualify for new equipment.  I would like to write for a new CPAP of 11 cm with EPR of 3, mask  of choice, sized to fit. A full night titration study may be considered to optimize treatment settings, monitor proper oxygen saturations and aid with improvement of tolerance and adherence, if needed, down the road. Alternative treatment options may include a dental device through dentistry or orthodontics in selected patients or Inspire (hypoglossal nerve stimulator) in carefully selected patients (meeting inclusion criteria).  Concomitant weight loss is recommended (where clinically appropriate). Please note that untreated obstructive sleep apnea may carry additional perioperative morbidity. Patients with significant obstructive sleep apnea should receive perioperative PAP therapy and the surgeons and particularly the anesthesiologist should be informed of the diagnosis and the severity of the sleep disordered breathing. The patient should be cautioned not to drive, work at heights, or operate dangerous or heavy equipment when tired or sleepy. Review and reiteration of good sleep hygiene measures should be pursued with any patient. Other causes of the patient's symptoms, including circadian rhythm disturbances, an underlying mood disorder, medication effect and/or an underlying medical problem cannot be ruled out based on this test. Clinical correlation is recommended.  The patient and his referring provider will be notified of the test results. The patient will be seen in follow up in sleep clinic at [facility].  I certify that I have reviewed the raw data recording prior to the issuance of  this report in accordance with the standards of the American Academy of Sleep Medicine (AASM).    INTERPRETING PHYSICIAN:   [name], MD, PhD Medical Director, [facility]) Diplomat, ABPN (Neurology and Sleep)   [facility] [address] [place], [place] [address] [phone]

## 2022-11-30 NOTE — Addendum Note (Signed)
Addended by: [name] on: 11/30/2022 06:27 PM   Modules accepted: Orders

## 2022-12-01 NOTE — Telephone Encounter (Addendum)
Adam Dunn, CMA  Adam Dunn Adam Dunn, Adam Dunn, Adam Dunn, Adam Dunn, Adam Dunn Adam Dunn orders have been placed for the above pt, DOB: [birth year] Thanks  Adam Dunn, Adam Dunn, Adam Dunn, CMA; Adam Dunn; 1 other Received, Thank you!

## 2022-12-21 DIAGNOSIS — G4733 Obstructive sleep apnea (adult) (pediatric): Secondary | ICD-10-CM | POA: Diagnosis not present

## 2022-12-22 ENCOUNTER — Other Ambulatory Visit (HOSPITAL_COMMUNITY): Payer: Self-pay

## 2022-12-23 ENCOUNTER — Other Ambulatory Visit: Payer: Self-pay | Admitting: Internal Medicine

## 2022-12-23 ENCOUNTER — Encounter: Payer: Self-pay | Admitting: Internal Medicine

## 2022-12-23 DIAGNOSIS — G43109 Migraine with aura, not intractable, without status migrainosus: Secondary | ICD-10-CM

## 2022-12-23 MED ORDER — ELETRIPTAN HYDROBROMIDE 40 MG PO TABS: 40.0000 mg | ORAL_TABLET | ORAL | 3 refills | Status: DC | PRN

## 2023-01-21 DIAGNOSIS — G4733 Obstructive sleep apnea (adult) (pediatric): Secondary | ICD-10-CM | POA: Diagnosis not present

## 2023-01-29 ENCOUNTER — Encounter: Payer: Self-pay | Admitting: Internal Medicine

## 2023-01-29 ENCOUNTER — Other Ambulatory Visit: Payer: Self-pay | Admitting: Family Medicine

## 2023-01-29 ENCOUNTER — Other Ambulatory Visit (HOSPITAL_COMMUNITY): Payer: Self-pay

## 2023-01-29 DIAGNOSIS — Z20828 Contact with and (suspected) exposure to other viral communicable diseases: Secondary | ICD-10-CM

## 2023-01-29 MED ORDER — OSELTAMIVIR PHOSPHATE 75 MG PO CAPS
75.0000 mg | ORAL_CAPSULE | Freq: Every day | ORAL | 0 refills | Status: DC
  Filled 2023-01-29: qty 10, 10d supply, fill #0

## 2023-02-01 ENCOUNTER — Other Ambulatory Visit (HOSPITAL_COMMUNITY): Payer: Self-pay

## 2023-02-01 MED ORDER — BUPROPION HCL ER (XL) 300 MG PO TB24
300.0000 mg | ORAL_TABLET | Freq: Every day | ORAL | 0 refills | Status: DC
  Filled 2023-02-01: qty 30, 30d supply, fill #0

## 2023-02-02 ENCOUNTER — Other Ambulatory Visit (HOSPITAL_COMMUNITY): Payer: Self-pay

## 2023-02-02 MED ORDER — BUPROPION HCL ER (XL) 300 MG PO TB24
300.0000 mg | ORAL_TABLET | Freq: Every day | ORAL | 0 refills | Status: DC
  Filled 2023-02-02 – 2023-02-27 (×2): qty 30, 30d supply, fill #0

## 2023-02-03 ENCOUNTER — Other Ambulatory Visit (HOSPITAL_COMMUNITY): Payer: Self-pay

## 2023-02-03 ENCOUNTER — Other Ambulatory Visit: Payer: Self-pay | Admitting: Family

## 2023-02-03 ENCOUNTER — Encounter: Payer: Self-pay | Admitting: Internal Medicine

## 2023-02-03 DIAGNOSIS — K219 Gastro-esophageal reflux disease without esophagitis: Secondary | ICD-10-CM

## 2023-02-03 MED ORDER — OMEPRAZOLE 20 MG PO CPDR
20.0000 mg | DELAYED_RELEASE_CAPSULE | Freq: Every day | ORAL | 1 refills | Status: DC | PRN
  Filled 2023-02-03: qty 90, 90d supply, fill #0

## 2023-02-11 ENCOUNTER — Other Ambulatory Visit (HOSPITAL_COMMUNITY): Payer: Self-pay

## 2023-02-11 ENCOUNTER — Other Ambulatory Visit: Payer: Self-pay | Admitting: Internal Medicine

## 2023-02-11 DIAGNOSIS — K219 Gastro-esophageal reflux disease without esophagitis: Secondary | ICD-10-CM | POA: Insufficient documentation

## 2023-02-11 DIAGNOSIS — G43109 Migraine with aura, not intractable, without status migrainosus: Secondary | ICD-10-CM

## 2023-02-11 MED ORDER — PANTOPRAZOLE SODIUM 40 MG PO TBEC
40.0000 mg | DELAYED_RELEASE_TABLET | Freq: Every day | ORAL | 0 refills | Status: DC
  Filled 2023-02-11 – 2023-03-02 (×2): qty 90, 90d supply, fill #0

## 2023-02-11 MED ORDER — ELETRIPTAN HYDROBROMIDE 40 MG PO TABS
40.0000 mg | ORAL_TABLET | ORAL | 3 refills | Status: DC | PRN
  Filled 2023-02-11 – 2023-02-16 (×2): qty 10, 1d supply, fill #0
  Filled 2023-02-16: qty 10, 30d supply, fill #0
  Filled 2023-03-02: qty 10, 1d supply, fill #0

## 2023-02-16 ENCOUNTER — Other Ambulatory Visit (HOSPITAL_COMMUNITY): Payer: Self-pay

## 2023-02-21 DIAGNOSIS — G4733 Obstructive sleep apnea (adult) (pediatric): Secondary | ICD-10-CM | POA: Diagnosis not present

## 2023-02-23 ENCOUNTER — Other Ambulatory Visit (HOSPITAL_COMMUNITY): Payer: Self-pay

## 2023-02-27 ENCOUNTER — Other Ambulatory Visit (HOSPITAL_COMMUNITY): Payer: Self-pay

## 2023-03-01 ENCOUNTER — Other Ambulatory Visit (HOSPITAL_COMMUNITY): Payer: Self-pay

## 2023-03-02 ENCOUNTER — Other Ambulatory Visit (HOSPITAL_COMMUNITY): Payer: Self-pay

## 2023-03-21 DIAGNOSIS — G4733 Obstructive sleep apnea (adult) (pediatric): Secondary | ICD-10-CM | POA: Diagnosis not present

## 2023-03-22 DIAGNOSIS — G4733 Obstructive sleep apnea (adult) (pediatric): Secondary | ICD-10-CM | POA: Diagnosis not present

## 2023-03-24 ENCOUNTER — Other Ambulatory Visit (HOSPITAL_COMMUNITY): Payer: Self-pay

## 2023-03-24 DIAGNOSIS — F331 Major depressive disorder, recurrent, moderate: Secondary | ICD-10-CM | POA: Diagnosis not present

## 2023-03-24 DIAGNOSIS — Z5181 Encounter for therapeutic drug level monitoring: Secondary | ICD-10-CM | POA: Diagnosis not present

## 2023-03-24 DIAGNOSIS — F411 Generalized anxiety disorder: Secondary | ICD-10-CM | POA: Diagnosis not present

## 2023-03-24 MED ORDER — HYDROXYZINE PAMOATE 25 MG PO CAPS
25.0000 mg | ORAL_CAPSULE | Freq: Every morning | ORAL | 0 refills | Status: DC
  Filled 2023-03-24: qty 45, 45d supply, fill #0

## 2023-03-24 MED ORDER — VENLAFAXINE HCL ER 75 MG PO CP24
75.0000 mg | ORAL_CAPSULE | Freq: Every morning | ORAL | 0 refills | Status: DC
  Filled 2023-03-24: qty 90, 90d supply, fill #0

## 2023-03-24 MED ORDER — BUPROPION HCL ER (XL) 300 MG PO TB24
300.0000 mg | ORAL_TABLET | Freq: Every day | ORAL | 0 refills | Status: DC
  Filled 2023-03-24: qty 90, 90d supply, fill #0

## 2023-04-01 ENCOUNTER — Encounter (HOSPITAL_COMMUNITY): Payer: Self-pay

## 2023-04-01 ENCOUNTER — Ambulatory Visit: Payer: 59 | Admitting: Internal Medicine

## 2023-04-01 ENCOUNTER — Other Ambulatory Visit (HOSPITAL_COMMUNITY): Payer: Self-pay

## 2023-04-01 VITALS — BP 138/88 | HR 76 | Temp 98.1°F | Resp 16 | Ht 66.0 in | Wt 270.0 lb

## 2023-04-01 DIAGNOSIS — I10 Essential (primary) hypertension: Secondary | ICD-10-CM | POA: Diagnosis not present

## 2023-04-01 DIAGNOSIS — K7581 Nonalcoholic steatohepatitis (NASH): Secondary | ICD-10-CM | POA: Diagnosis not present

## 2023-04-01 DIAGNOSIS — K219 Gastro-esophageal reflux disease without esophagitis: Secondary | ICD-10-CM

## 2023-04-01 DIAGNOSIS — J4520 Mild intermittent asthma, uncomplicated: Secondary | ICD-10-CM

## 2023-04-01 DIAGNOSIS — E66813 Obesity, class 3: Secondary | ICD-10-CM

## 2023-04-01 DIAGNOSIS — Z23 Encounter for immunization: Secondary | ICD-10-CM

## 2023-04-01 DIAGNOSIS — Z8042 Family history of malignant neoplasm of prostate: Secondary | ICD-10-CM

## 2023-04-01 DIAGNOSIS — Z6841 Body Mass Index (BMI) 40.0 and over, adult: Secondary | ICD-10-CM

## 2023-04-01 LAB — CBC WITH DIFFERENTIAL/PLATELET
Basophils Absolute: 0.1 10*3/uL (ref 0.0–0.1)
Basophils Relative: 0.8 % (ref 0.0–3.0)
Eosinophils Absolute: 0.2 10*3/uL (ref 0.0–0.7)
Eosinophils Relative: 2.2 % (ref 0.0–5.0)
HCT: 43.9 % (ref 39.0–52.0)
Hemoglobin: 14.9 g/dL (ref 13.0–17.0)
Lymphocytes Relative: 19.7 % (ref 12.0–46.0)
Lymphs Abs: 1.5 10*3/uL (ref 0.7–4.0)
MCHC: 34 g/dL (ref 30.0–36.0)
MCV: 87 fl (ref 78.0–100.0)
Monocytes Absolute: 1 10*3/uL (ref 0.1–1.0)
Monocytes Relative: 12.8 % — ABNORMAL HIGH (ref 3.0–12.0)
Neutro Abs: 5 10*3/uL (ref 1.4–7.7)
Neutrophils Relative %: 64.5 % (ref 43.0–77.0)
Platelets: 318 10*3/uL (ref 150.0–400.0)
RBC: 5.05 Mil/uL (ref 4.22–5.81)
RDW: 12.8 % (ref 11.5–15.5)
WBC: 7.7 10*3/uL (ref 4.0–10.5)

## 2023-04-01 LAB — LIPID PANEL
Cholesterol: 174 mg/dL (ref 0–200)
HDL: 41.8 mg/dL (ref >39.00)
LDL Cholesterol: 117 mg/dL — ABNORMAL HIGH (ref 0–99)
NonHDL: 132.68
Total CHOL/HDL Ratio: 4
Triglycerides: 79 mg/dL (ref 0.0–149.0)
VLDL: 15.8 mg/dL (ref 0.0–40.0)

## 2023-04-01 LAB — BASIC METABOLIC PANEL WITH GFR
BUN: 17 mg/dL (ref 6–23)
CO2: 31 meq/L (ref 19–32)
Calcium: 9.7 mg/dL (ref 8.4–10.5)
Chloride: 100 meq/L (ref 96–112)
Creatinine, Ser: 0.88 mg/dL (ref 0.40–1.50)
GFR: 104.97 mL/min (ref >60.00)
Glucose, Bld: 95 mg/dL (ref 70–99)
Potassium: 4.5 meq/L (ref 3.5–5.1)
Sodium: 138 meq/L (ref 135–145)

## 2023-04-01 LAB — URINALYSIS, ROUTINE W REFLEX MICROSCOPIC
Bilirubin Urine: NEGATIVE
Hgb urine dipstick: NEGATIVE
Ketones, ur: NEGATIVE
Leukocytes,Ua: NEGATIVE
Nitrite: NEGATIVE
RBC / HPF: NONE SEEN (ref 0–?)
Specific Gravity, Urine: 1.01 (ref 1.000–1.030)
Total Protein, Urine: NEGATIVE
Urine Glucose: NEGATIVE
Urobilinogen, UA: 0.2 (ref 0.0–1.0)
pH: 6 (ref 5.0–8.0)

## 2023-04-01 LAB — HEPATIC FUNCTION PANEL
ALT: 35 U/L (ref 0–53)
AST: 21 U/L (ref 0–37)
Albumin: 4.8 g/dL (ref 3.5–5.2)
Alkaline Phosphatase: 66 U/L (ref 39–117)
Bilirubin, Direct: 0.2 mg/dL (ref 0.0–0.3)
Total Bilirubin: 0.9 mg/dL (ref 0.2–1.2)
Total Protein: 7.2 g/dL (ref 6.0–8.3)

## 2023-04-01 LAB — PROTIME-INR
INR: 1.2 ratio — ABNORMAL HIGH (ref 0.8–1.0)
Prothrombin Time: 13 s (ref 9.6–13.1)

## 2023-04-01 LAB — HEMOGLOBIN A1C: Hgb A1c MFr Bld: 5.6 % (ref 4.6–6.5)

## 2023-04-01 MED ORDER — BUDESONIDE-FORMOTEROL FUMARATE 160-4.5 MCG/ACT IN AERO
2.0000 | INHALATION_SPRAY | Freq: Two times a day (BID) | RESPIRATORY_TRACT | 1 refills | Status: DC
  Filled 2023-04-01: qty 30.6, 90d supply, fill #0

## 2023-04-01 MED ORDER — AIRSUPRA 90-80 MCG/ACT IN AERO
2.0000 | INHALATION_SPRAY | RESPIRATORY_TRACT | 1 refills | Status: DC | PRN
  Filled 2023-04-01: qty 32.1, 90d supply, fill #0

## 2023-04-01 NOTE — Progress Notes (Unsigned)
 Subjective:  Patient ID: Adam Dunn, male    DOB: [birth year]  Age: 43 y.o. MRN: [id number]  CC: Hypertension and Asthma   HPI Jobany Montellano Lafoy presents for f/up ----  Discussed the use of AI scribe software for clinical note transcription with the patient, who gave verbal consent to proceed.  History of Present Illness   Adam Dunn is a 43 year old male with asthma and hypertension who presents for follow-up of his respiratory symptoms and blood pressure management.  He experiences respiratory symptoms that worsen with minor illnesses, leading to a persistent cough lasting about 45 days. This cough is described as 'really annoying' and causes significant discomfort, including pain in his side from prolonged coughing. He uses a powder inhaler, which takes about three weeks to be effective, and has an Airsupra inhaler for rescue use, which he uses occasionally during coughing spells. No current asthma symptoms, but when he gets sick, it often results in a prolonged cough. He is not currently using Symbicort but has used Advair in the past.  He experiences chest pain regularly at home, although no headaches, blurred vision, or chest pain during the visit. His blood pressure readings are generally higher during medical visits. He is currently taking irbesartan for blood pressure management.  He has been actively losing weight by avoiding bread and sugar, which has made him feel better, although he is frustrated by a plateau in weight loss over the past three months. Despite this, he continues with the diet as it improves his overall well-being.  His current medications include bupropion, Effexor, and Relpax as needed, with a recent switch from omeprazole to another medication for acid reflux. He does not take hydroxyzine.  No issues with sleep, stating he sleeps well and does not experience snoring or sleep apnea. No issues with bowel movements and no pain associated with them.        Outpatient Medications Prior to Visit  Medication Sig Dispense Refill   buPROPion (WELLBUTRIN XL) 300 MG 24 hr tablet Take 1 tablet (300 mg total) by mouth daily. 30 tablet 0   Cholecalciferol 2000 units TABS Take 1 tablet (2,000 Units total) by mouth daily. 90 tablet 1   eletriptan (RELPAX) 40 MG tablet Take 1 tablet (40 mg total) by mouth every 2 (two) hours as needed for migraine or headache. May repeat in 2 hours if headache persists or recurs. 10 tablet 3   pantoprazole (PROTONIX) 40 MG tablet Take 1 tablet (40 mg total) by mouth daily. 90 tablet 0   venlafaxine XR (EFFEXOR XR) 75 MG 24 hr capsule Take 1 capsule (75 mg total) by mouth every morning. 90 capsule 0   Albuterol-Budesonide (AIRSUPRA) 90-80 MCG/ACT AERO Inhale 2 puffs into the lungs 4 (four) times daily as needed. (Patient taking differently: Inhale 2 puffs into the lungs as needed.) 32.1 g 1   budesonide-formoterol (SYMBICORT) 160-4.5 MCG/ACT inhaler Inhale 2 puffs into the lungs 2 (two) times daily. 30.6 g 1   irbesartan (AVAPRO) 300 MG tablet Take 1 tablet (300 mg total) by mouth daily. 90 tablet 1   buPROPion (WELLBUTRIN XL) 300 MG 24 hr tablet Take 1 tablet (300 mg total) by mouth daily. 90 tablet 0   fluticasone (FLONASE) 50 MCG/ACT nasal spray Place 2 sprays into both nostrils daily. 16 g 0   hydrOXYzine (VISTARIL) 25 MG capsule Take 1 capsule (25 mg total) by mouth in the morning. 45 capsule 1   hydrOXYzine (VISTARIL) 25 MG  capsule Take 1 capsule (25 mg total) by mouth every morning. 45 capsule 0   No facility-administered medications prior to visit.    ROS Review of Systems  Constitutional:  Negative for chills, diaphoresis and fatigue.  HENT: Negative.    Eyes: Negative.   Respiratory:  Positive for cough and wheezing. Negative for choking, chest tightness and shortness of breath.   Cardiovascular:  Negative for chest pain, palpitations and leg swelling.  Gastrointestinal:  Negative for abdominal pain,  constipation, diarrhea, nausea and vomiting.  Genitourinary: Negative.   Musculoskeletal: Negative.  Negative for arthralgias and myalgias.  Skin: Negative.  Negative for color change.  Neurological: Negative.  Negative for dizziness and weakness.  Hematological:  Negative for adenopathy. Does not bruise/bleed easily.  Psychiatric/Behavioral: Negative.      Objective:  BP 138/88 (BP Location: Left Arm, Patient Position: Sitting, Cuff Size: Large)   Pulse 76   Temp 98.1 F (36.7 C) (Oral)   Resp 16   Ht 5\' 6"  (1.676 m)   Wt 270 lb (122.5 kg)   SpO2 98%   BMI 43.58 kg/m   BP Readings from Last 3 Encounters:  04/01/23 138/88  11/02/22 (!) 131/91  10/02/22 (!) 136/94    Wt Readings from Last 3 Encounters:  04/01/23 270 lb (122.5 kg)  11/02/22 299 lb (135.6 kg)  10/02/22 297 lb (134.7 kg)    Physical Exam Vitals reviewed.  Constitutional:      Appearance: He is obese. He is not ill-appearing.  HENT:     Nose: Nose normal.     Mouth/Throat:     Mouth: Mucous membranes are moist.  Eyes:     General: No scleral icterus.    Conjunctiva/sclera: Conjunctivae normal.  Cardiovascular:     Rate and Rhythm: Normal rate and regular rhythm.     Heart sounds: No murmur heard.    No friction rub. No gallop.  Pulmonary:     Effort: Pulmonary effort is normal.     Breath sounds: No stridor. No wheezing, rhonchi or rales.  Abdominal:     General: Abdomen is protuberant. Bowel sounds are normal. There is no distension.     Palpations: Abdomen is soft. There is no hepatomegaly, splenomegaly or mass.     Tenderness: There is no abdominal tenderness. There is no guarding.  Musculoskeletal:        General: Normal range of motion.     Cervical back: Neck supple.     Right lower leg: No edema.     Left lower leg: No edema.  Lymphadenopathy:     Cervical: No cervical adenopathy.  Skin:    General: Skin is warm and dry.  Neurological:     General: No focal deficit present.      Mental Status: He is alert. Mental status is at baseline.  Psychiatric:        Mood and Affect: Mood normal.        Behavior: Behavior normal.     Lab Results  Component Value Date   WBC 7.7 04/01/2023   HGB 14.9 04/01/2023   HCT 43.9 04/01/2023   PLT 318.0 04/01/2023   GLUCOSE 95 04/01/2023   CHOL 174 04/01/2023   TRIG 79.0 04/01/2023   HDL 41.80 04/01/2023   LDLCALC 117 (H) 04/01/2023   ALT 35 04/01/2023   AST 21 04/01/2023   NA 138 04/01/2023   K 4.5 04/01/2023   CL 100 04/01/2023   CREATININE 0.88 04/01/2023   BUN 17  04/01/2023   CO2 31 04/01/2023   TSH 3.46 04/01/2023   PSA 0.92 04/01/2023   INR 1.2 (H) 04/01/2023   HGBA1C 5.6 04/01/2023    DG Foot Complete Right Result Date: 11/23/2017 Please see detailed radiograph report in office note.  DG Foot Complete Left Result Date: 11/23/2017 Please see detailed radiograph report in office note.  US Abdomen Limited RUQ Result Date: 11/22/2017 CLINICAL DATA:  Elevated liver enzymes EXAM: ULTRASOUND ABDOMEN LIMITED RIGHT UPPER QUADRANT COMPARISON:  None. FINDINGS: Gallbladder: No gallstones or wall thickening visualized. There is no pericholecystic fluid. No sonographic Murphy sign noted by sonographer. Common bile duct: Diameter: 3 mm. No intrahepatic or extrahepatic biliary duct dilatation. Liver: No focal lesion identified. Liver echogenicity is increased diffusely. Portal vein is patent on color Doppler imaging with normal direction of blood flow towards the liver. IMPRESSION: Diffuse increase in liver echogenicity, a finding felt to be indicative of hepatic steatosis. While no focal liver lesions are evident on this study, it must be cautioned that the sensitivity of ultrasound for detection of focal liver lesions is diminished in this circumstance. Study otherwise unremarkable. Electronically Signed   By: Bretta Bang III M.D.   On: 11/22/2017 11:46    Assessment & Plan:  Nonalcoholic steatohepatitis (NASH)- MELD  is 8. -     Hepatic function panel; Future -     Protime-INR; Future  Gastroesophageal reflux disease without esophagitis -     CBC with Differential/Platelet; Future  Essential hypertension- He has not achieved his BP goal. Will continue the ARB and he will continue working on his lifestyle modifications. -     Basic metabolic panel with GFR; Future -     Urinalysis, Routine w reflex microscopic; Future -     Irbesartan; Take 1 tablet (300 mg total) by mouth daily.  Dispense: 90 tablet; Refill: 1  Family history of prostate cancer in father -     PSA; Future  Class 3 severe obesity due to excess calories with serious comorbidity and body mass index (BMI) of 45.0 to 49.9 in adult [facility]) -     Lipid panel; Future -     TSH; Future -     Hemoglobin A1c; Future  Mild intermittent asthma without complication -     Airsupra; Inhale 2 puffs into the lungs as needed.  Dispense: 32.1 g; Refill: 1 -     Budesonide-Formoterol Fumarate; Inhale 2 puffs into the lungs 2 (two) times daily.  Dispense: 30.6 g; Refill: 1 -     Pneumococcal conjugate vaccine 20-valent     Follow-up: Return in about 6 months (around 10/02/2023).  Sanda Linger, MD

## 2023-04-01 NOTE — Patient Instructions (Signed)
 Asthma, Adult  Asthma is a long-term (chronic) condition that causes recurrent episodes in which the lower airways in the lungs become tight and narrow. The narrowing is caused by inflammation and tightening of the smooth muscle around the lower airways. Asthma episodes, also called asthma attacks or asthma flares, may cause coughing, making high-pitched whistling sounds when you breathe, most often when you breathe out (wheezing), shortness of breath, and chest pain. The airways may produce extra mucus caused by the inflammation and irritation. During an attack, it can be difficult to breathe. Asthma attacks can range from minor to life-threatening. Asthma cannot be cured, but medicines and lifestyle changes can help control it and treat acute attacks. It is important to keep your asthma well controlled so the condition does not interfere with your daily life. What are the causes? This condition is believed to be caused by inherited (genetic) and environmental factors, but its exact cause is not known. What can trigger an asthma attack? Many things can bring on an asthma attack or make symptoms worse. These triggers are different for every person. Common triggers include: Allergens and irritants like mold, dust, pet dander, cockroaches, pollen, air pollution, and chemical odors. Cigarette smoke. Weather changes and cold air. Stress and strong emotional responses such as crying or laughing hard. Certain medications such as aspirin or beta blockers. Infections and inflammatory conditions, such as the flu, a cold, pneumonia, or inflammation of the nasal membranes (rhinitis). Gastroesophageal reflux disease (GERD). What are the signs or symptoms? Symptoms may occur right after exposure to an asthma trigger or hours later and can vary by person. Common signs and symptoms include: Wheezing. Trouble breathing (shortness of breath). Excessive nighttime or early morning coughing. Chest  tightness. Tiredness (fatigue) with minimal activity. Difficulty talking in complete sentences. Poor exercise tolerance. How is this diagnosed? This condition is diagnosed based on: A physical exam and your medical history. Tests, which may include: Lung function studies to evaluate the flow of air in your lungs. Allergy tests. Imaging tests, such as X-rays. How is this treated? There is no cure, but symptoms can be controlled with proper treatment. Treatment usually involves: Identifying and avoiding your asthma triggers. Inhaled medicines. Two types are commonly used to treat asthma, depending on severity: Controller medicines. These help prevent asthma symptoms from occurring. They are taken every day. Fast-acting reliever or rescue medicines. These quickly relieve asthma symptoms. They are used as needed and provide short-term relief. Using other medicines, such as: Allergy medicines, such as antihistamines, if your asthma attacks are triggered by allergens. Immune medicines (immunomodulators). These are medicines that help control the immune system. Using supplemental oxygen. This is only needed during a severe episode. Creating an asthma action plan. An asthma action plan is a written plan for managing and treating your asthma attacks. This plan includes: A list of your asthma triggers and how to avoid them. Information about when medicines should be taken and when their dosage should be changed. Instructions about using a device called a peak flow meter. A peak flow meter measures how well the lungs are working and the severity of your asthma. It helps you monitor your condition. Follow these instructions at home: Take over-the-counter and prescription medicines only as told by your health care provider. Stay up to date on all vaccinations as recommended by your healthcare provider, including vaccines for the flu and pneumonia. Use a peak flow meter and keep track of your peak flow  readings. Understand and use your asthma  action plan to address any asthma flares. Do not smoke or allow anyone to smoke in your home. Contact a health care provider if: You have wheezing, shortness of breath, or a cough that is not responding to medicines. Your medicines are causing side effects, such as a rash, itching, swelling, or trouble breathing. You need to use a reliever medicine more than 2-3 times a week. Your peak flow reading is still at 50-79% of your personal best after following your action plan for 1 hour. You have a fever and shortness of breath. Get help right away if: You are getting worse and do not respond to treatment during an asthma attack. You are short of breath when at rest or when doing very little physical activity. You have difficulty eating, drinking, or talking. You have chest pain or tightness. You develop a fast heartbeat or palpitations. You have a bluish color to your lips or fingernails. You are light-headed or dizzy, or you faint. Your peak flow reading is less than 50% of your personal best. You feel too tired to breathe normally. These symptoms may be an emergency. Get help right away. Call 911. Do not wait to see if the symptoms will go away. Do not drive yourself to the hospital. Summary Asthma is a long-term (chronic) condition that causes recurrent episodes in which the airways become tight and narrow. Asthma episodes, also called asthma attacks or asthma flares, can cause coughing, wheezing, shortness of breath, and chest pain. Asthma cannot be cured, but medicines and lifestyle changes can help keep it well controlled and prevent asthma flares. Make sure you understand how to avoid triggers and how and when to use your medicines. Asthma attacks can range from minor to life-threatening. Get help right away if you have an asthma attack and do not respond to treatment with your usual rescue medicines. This information is not intended to replace  advice given to you by your health care provider. Make sure you discuss any questions you have with your health care provider. Document Revised: 10/09/2020 Document Reviewed: 09/30/2020 Elsevier Patient Education  2024 [employer].

## 2023-04-02 ENCOUNTER — Encounter (HOSPITAL_COMMUNITY): Payer: Self-pay | Admitting: Pharmacist

## 2023-04-02 ENCOUNTER — Other Ambulatory Visit: Payer: Self-pay | Admitting: Internal Medicine

## 2023-04-02 ENCOUNTER — Other Ambulatory Visit (HOSPITAL_COMMUNITY): Payer: Self-pay

## 2023-04-02 ENCOUNTER — Encounter: Payer: Self-pay | Admitting: Internal Medicine

## 2023-04-02 DIAGNOSIS — G4733 Obstructive sleep apnea (adult) (pediatric): Secondary | ICD-10-CM

## 2023-04-02 LAB — PSA: PSA: 0.92 ng/mL (ref 0.10–4.00)

## 2023-04-02 LAB — TSH: TSH: 3.46 u[IU]/mL (ref 0.35–5.50)

## 2023-04-02 MED ORDER — IRBESARTAN 300 MG PO TABS
300.0000 mg | ORAL_TABLET | Freq: Every day | ORAL | 1 refills | Status: DC
  Filled 2023-04-02 – 2023-07-13 (×2): qty 90, 90d supply, fill #0
  Filled 2023-09-02: qty 90, 90d supply, fill #1

## 2023-04-02 MED ORDER — ZEPBOUND 2.5 MG/0.5ML ~~LOC~~ SOAJ
2.5000 mg | SUBCUTANEOUS | 0 refills | Status: DC
Start: 2023-04-02 — End: 2023-10-04
  Filled 2023-04-02: qty 2, 28d supply, fill #0

## 2023-04-14 ENCOUNTER — Other Ambulatory Visit (HOSPITAL_COMMUNITY): Payer: Self-pay

## 2023-04-21 DIAGNOSIS — G4733 Obstructive sleep apnea (adult) (pediatric): Secondary | ICD-10-CM | POA: Diagnosis not present

## 2023-05-21 DIAGNOSIS — G4733 Obstructive sleep apnea (adult) (pediatric): Secondary | ICD-10-CM | POA: Diagnosis not present

## 2023-06-10 ENCOUNTER — Other Ambulatory Visit: Payer: Self-pay | Admitting: Internal Medicine

## 2023-06-10 DIAGNOSIS — K219 Gastro-esophageal reflux disease without esophagitis: Secondary | ICD-10-CM

## 2023-06-11 ENCOUNTER — Other Ambulatory Visit (HOSPITAL_COMMUNITY): Payer: Self-pay

## 2023-06-11 MED ORDER — PANTOPRAZOLE SODIUM 40 MG PO TBEC
40.0000 mg | DELAYED_RELEASE_TABLET | Freq: Every day | ORAL | 0 refills | Status: DC
  Filled 2023-06-11: qty 90, 90d supply, fill #0

## 2023-06-21 DIAGNOSIS — G4733 Obstructive sleep apnea (adult) (pediatric): Secondary | ICD-10-CM | POA: Diagnosis not present

## 2023-07-13 ENCOUNTER — Other Ambulatory Visit: Payer: Self-pay

## 2023-07-13 ENCOUNTER — Other Ambulatory Visit (HOSPITAL_COMMUNITY): Payer: Self-pay

## 2023-07-20 ENCOUNTER — Other Ambulatory Visit (HOSPITAL_COMMUNITY): Payer: Self-pay

## 2023-07-21 ENCOUNTER — Other Ambulatory Visit (HOSPITAL_COMMUNITY): Payer: Self-pay

## 2023-07-21 DIAGNOSIS — G4733 Obstructive sleep apnea (adult) (pediatric): Secondary | ICD-10-CM | POA: Diagnosis not present

## 2023-07-22 ENCOUNTER — Other Ambulatory Visit (HOSPITAL_COMMUNITY): Payer: Self-pay

## 2023-07-22 ENCOUNTER — Other Ambulatory Visit: Payer: Self-pay | Admitting: Internal Medicine

## 2023-07-22 DIAGNOSIS — G43109 Migraine with aura, not intractable, without status migrainosus: Secondary | ICD-10-CM

## 2023-07-23 ENCOUNTER — Other Ambulatory Visit (HOSPITAL_COMMUNITY): Payer: Self-pay

## 2023-07-23 ENCOUNTER — Other Ambulatory Visit: Payer: Self-pay | Admitting: Internal Medicine

## 2023-07-23 DIAGNOSIS — G43109 Migraine with aura, not intractable, without status migrainosus: Secondary | ICD-10-CM

## 2023-07-28 ENCOUNTER — Other Ambulatory Visit (HOSPITAL_COMMUNITY): Payer: Self-pay

## 2023-08-02 ENCOUNTER — Encounter: Payer: Self-pay | Admitting: Internal Medicine

## 2023-08-02 ENCOUNTER — Other Ambulatory Visit (HOSPITAL_COMMUNITY): Payer: Self-pay

## 2023-08-02 MED ORDER — VENLAFAXINE HCL ER 75 MG PO CP24
75.0000 mg | ORAL_CAPSULE | Freq: Every morning | ORAL | 0 refills | Status: DC
  Filled 2023-08-02: qty 90, 90d supply, fill #0

## 2023-08-03 ENCOUNTER — Other Ambulatory Visit (HOSPITAL_COMMUNITY): Payer: Self-pay

## 2023-08-03 ENCOUNTER — Other Ambulatory Visit: Payer: Self-pay | Admitting: Internal Medicine

## 2023-08-03 DIAGNOSIS — H5213 Myopia, bilateral: Secondary | ICD-10-CM | POA: Diagnosis not present

## 2023-08-03 DIAGNOSIS — G43109 Migraine with aura, not intractable, without status migrainosus: Secondary | ICD-10-CM

## 2023-08-03 MED ORDER — ONDANSETRON HCL 8 MG PO TABS
8.0000 mg | ORAL_TABLET | Freq: Every day | ORAL | 2 refills | Status: AC | PRN
  Filled 2023-08-03: qty 30, 30d supply, fill #0

## 2023-08-21 DIAGNOSIS — G4733 Obstructive sleep apnea (adult) (pediatric): Secondary | ICD-10-CM | POA: Diagnosis not present

## 2023-09-02 ENCOUNTER — Other Ambulatory Visit (HOSPITAL_COMMUNITY): Payer: Self-pay

## 2023-09-02 DIAGNOSIS — Z5181 Encounter for therapeutic drug level monitoring: Secondary | ICD-10-CM | POA: Diagnosis not present

## 2023-09-02 DIAGNOSIS — F331 Major depressive disorder, recurrent, moderate: Secondary | ICD-10-CM | POA: Diagnosis not present

## 2023-09-02 DIAGNOSIS — F411 Generalized anxiety disorder: Secondary | ICD-10-CM | POA: Diagnosis not present

## 2023-09-02 MED ORDER — BUPROPION HCL ER (XL) 300 MG PO TB24
300.0000 mg | ORAL_TABLET | Freq: Every day | ORAL | 0 refills | Status: DC
  Filled 2023-09-02: qty 90, 90d supply, fill #0

## 2023-09-02 MED ORDER — VENLAFAXINE HCL ER 75 MG PO CP24
75.0000 mg | ORAL_CAPSULE | Freq: Every morning | ORAL | 0 refills | Status: DC
  Filled 2023-10-31: qty 90, 90d supply, fill #0

## 2023-09-17 ENCOUNTER — Other Ambulatory Visit (HOSPITAL_COMMUNITY): Payer: Self-pay

## 2023-09-17 ENCOUNTER — Other Ambulatory Visit: Payer: Self-pay | Admitting: Internal Medicine

## 2023-09-17 DIAGNOSIS — K219 Gastro-esophageal reflux disease without esophagitis: Secondary | ICD-10-CM

## 2023-09-17 MED ORDER — PANTOPRAZOLE SODIUM 40 MG PO TBEC
40.0000 mg | DELAYED_RELEASE_TABLET | Freq: Every day | ORAL | 1 refills | Status: AC
Start: 2023-09-17 — End: ?
  Filled 2023-09-17: qty 90, 90d supply, fill #0
  Filled 2023-11-29: qty 90, 90d supply, fill #1

## 2023-09-21 DIAGNOSIS — G4733 Obstructive sleep apnea (adult) (pediatric): Secondary | ICD-10-CM | POA: Diagnosis not present

## 2023-10-04 ENCOUNTER — Encounter: Payer: Self-pay | Admitting: Internal Medicine

## 2023-10-04 ENCOUNTER — Ambulatory Visit: Admitting: Internal Medicine

## 2023-10-04 ENCOUNTER — Other Ambulatory Visit (HOSPITAL_COMMUNITY): Payer: Self-pay

## 2023-10-04 ENCOUNTER — Encounter (HOSPITAL_COMMUNITY): Payer: Self-pay

## 2023-10-04 VITALS — BP 136/88 | HR 81 | Temp 98.3°F | Resp 16 | Ht 66.0 in | Wt 309.8 lb

## 2023-10-04 DIAGNOSIS — Z0001 Encounter for general adult medical examination with abnormal findings: Secondary | ICD-10-CM

## 2023-10-04 DIAGNOSIS — J4520 Mild intermittent asthma, uncomplicated: Secondary | ICD-10-CM | POA: Diagnosis not present

## 2023-10-04 DIAGNOSIS — G43109 Migraine with aura, not intractable, without status migrainosus: Secondary | ICD-10-CM | POA: Diagnosis not present

## 2023-10-04 DIAGNOSIS — I10 Essential (primary) hypertension: Secondary | ICD-10-CM | POA: Diagnosis not present

## 2023-10-04 DIAGNOSIS — G4733 Obstructive sleep apnea (adult) (pediatric): Secondary | ICD-10-CM | POA: Diagnosis not present

## 2023-10-04 DIAGNOSIS — Z0000 Encounter for general adult medical examination without abnormal findings: Secondary | ICD-10-CM

## 2023-10-04 DIAGNOSIS — Z23 Encounter for immunization: Secondary | ICD-10-CM

## 2023-10-04 DIAGNOSIS — K7581 Nonalcoholic steatohepatitis (NASH): Secondary | ICD-10-CM

## 2023-10-04 DIAGNOSIS — Z8042 Family history of malignant neoplasm of prostate: Secondary | ICD-10-CM

## 2023-10-04 LAB — CBC WITH DIFFERENTIAL/PLATELET
Basophils Absolute: 0.1 K/uL (ref 0.0–0.1)
Basophils Relative: 1 % (ref 0.0–3.0)
Eosinophils Absolute: 0.3 K/uL (ref 0.0–0.7)
Eosinophils Relative: 4 % (ref 0.0–5.0)
HCT: 42.8 % (ref 39.0–52.0)
Hemoglobin: 14.3 g/dL (ref 13.0–17.0)
Lymphocytes Relative: 22.2 % (ref 12.0–46.0)
Lymphs Abs: 2 K/uL (ref 0.7–4.0)
MCHC: 33.4 g/dL (ref 30.0–36.0)
MCV: 87.8 fl (ref 78.0–100.0)
Monocytes Absolute: 0.8 K/uL (ref 0.1–1.0)
Monocytes Relative: 9.4 % (ref 3.0–12.0)
Neutro Abs: 5.6 K/uL (ref 1.4–7.7)
Neutrophils Relative %: 63.4 % (ref 43.0–77.0)
Platelets: 324 K/uL (ref 150.0–400.0)
RBC: 4.87 Mil/uL (ref 4.22–5.81)
RDW: 13.2 % (ref 11.5–15.5)
WBC: 8.8 K/uL (ref 4.0–10.5)

## 2023-10-04 LAB — HEPATIC FUNCTION PANEL
ALT: 79 U/L — ABNORMAL HIGH (ref 0–53)
AST: 36 U/L (ref 0–37)
Albumin: 4.7 g/dL (ref 3.5–5.2)
Alkaline Phosphatase: 72 U/L (ref 39–117)
Bilirubin, Direct: 0.1 mg/dL (ref 0.0–0.3)
Total Bilirubin: 0.7 mg/dL (ref 0.2–1.2)
Total Protein: 7.1 g/dL (ref 6.0–8.3)

## 2023-10-04 LAB — PSA: PSA: 0.84 ng/mL (ref 0.10–4.00)

## 2023-10-04 LAB — BASIC METABOLIC PANEL WITH GFR
BUN: 15 mg/dL (ref 6–23)
CO2: 30 meq/L (ref 19–32)
Calcium: 9.6 mg/dL (ref 8.4–10.5)
Chloride: 102 meq/L (ref 96–112)
Creatinine, Ser: 0.9 mg/dL (ref 0.40–1.50)
GFR: 103.89 mL/min (ref >60.00)
Glucose, Bld: 86 mg/dL (ref 70–99)
Potassium: 4 meq/L (ref 3.5–5.1)
Sodium: 140 meq/L (ref 135–145)

## 2023-10-04 MED ORDER — ZEPBOUND 2.5 MG/0.5ML ~~LOC~~ SOAJ
2.5000 mg | SUBCUTANEOUS | 0 refills | Status: DC
  Filled 2023-10-04: qty 4, 56d supply, fill #0
  Filled 2023-11-02 (×2): qty 2, 28d supply, fill #0

## 2023-10-04 MED ORDER — IRBESARTAN 300 MG PO TABS
300.0000 mg | ORAL_TABLET | Freq: Every day | ORAL | 1 refills | Status: AC
  Filled 2023-10-04: qty 90, 90d supply, fill #0
  Filled 2024-01-25: qty 90, 90d supply, fill #1

## 2023-10-04 MED ORDER — BUDESONIDE-FORMOTEROL FUMARATE 160-4.5 MCG/ACT IN AERO
2.0000 | INHALATION_SPRAY | Freq: Two times a day (BID) | RESPIRATORY_TRACT | 1 refills | Status: AC
  Filled 2023-10-04: qty 30.6, 90d supply, fill #0

## 2023-10-04 MED ORDER — ELETRIPTAN HYDROBROMIDE 40 MG PO TABS
40.0000 mg | ORAL_TABLET | ORAL | 3 refills | Status: AC | PRN
  Filled 2023-10-04: qty 10, 30d supply, fill #0

## 2023-10-04 MED ORDER — AIRSUPRA 90-80 MCG/ACT IN AERO
2.0000 | INHALATION_SPRAY | RESPIRATORY_TRACT | 1 refills | Status: AC | PRN
  Filled 2023-10-04: qty 32.1, 90d supply, fill #0

## 2023-10-04 NOTE — Progress Notes (Unsigned)
 Subjective:  Patient ID: Adam Dunn, male    DOB: 06-28-1980  Age: 43 y.o. MRN: [id number]  CC: Annual Exam, Hypertension, and Asthma   HPI Adam Dunn presents for a CPX and f/up ----  Discussed the use of AI scribe software for clinical note transcription with the patient, who gave verbal consent to proceed.  History of Present Illness Adam Dunn is a 43 year old male who presents for a routine follow-up visit.  He feels generally well and has been more active than before. He experiences shortness of breath when walking up steep hills, such as during golf, but not on flat surfaces. No chest pain, dizziness, or lightheadedness during activity.  He experiences constant headaches, which he describes as his 'usual headaches.' He checks his blood pressure during these episodes and has not found a correlation between the headaches and high blood pressure. He has had two migraines in the last week, which is unusual as he typically experiences one to two migraines per month. He identifies current weather conditions as a trigger for his migraines.  He is currently taking pantoprazole , bupropion , and venlafaxine  (Effexor ). He uses medication for nausea and vomiting generally when he takes amitriptyline, which is associated with his migraines.  He received three initial COVID vaccines but has not received any more since then.       Outpatient Medications Prior to Visit  Medication Sig Dispense Refill   buPROPion  (WELLBUTRIN  XL) 300 MG 24 hr tablet Take 1 tablet (300 mg total) by mouth daily. 90 tablet 0   Cholecalciferol  2000 units TABS Take 1 tablet (2,000 Units total) by mouth daily. 90 tablet 1   ondansetron  (ZOFRAN ) 8 MG tablet Take 1 tablet (8 mg total) by mouth daily as needed for nausea or vomiting. 30 tablet 2   pantoprazole  (PROTONIX ) 40 MG tablet Take 1 tablet (40 mg total) by mouth daily. 90 tablet 1   venlafaxine  XR (EFFEXOR  XR) 75 MG 24 hr capsule Take 1 capsule (75 mg  total) by mouth every morning with food. 90 capsule 0   Albuterol -Budesonide  (AIRSUPRA ) 90-80 MCG/ACT AERO Inhale 2 puffs into the lungs as needed. 32.1 g 1   budesonide -formoterol  (SYMBICORT ) 160-4.5 MCG/ACT inhaler Inhale 2 puffs into the lungs 2 (two) times daily. 30.6 g 1   buPROPion  (WELLBUTRIN  XL) 300 MG 24 hr tablet Take 1 tablet (300 mg total) by mouth daily. 30 tablet 0   eletriptan  (RELPAX ) 40 MG tablet Take 1 tablet (40 mg total) by mouth every 2 (two) hours as needed for migraine or headache. May repeat in 2 hours if headache persists or recurs. 10 tablet 3   irbesartan  (AVAPRO ) 300 MG tablet Take 1 tablet (300 mg total) by mouth daily. 90 tablet 1   tirzepatide  (ZEPBOUND ) 2.5 MG/0.5ML Pen Inject 2.5 mg into the skin once a week. 4 mL 0   venlafaxine  XR (EFFEXOR  XR) 75 MG 24 hr capsule Take 1 capsule (75 mg total) by mouth every morning. 90 capsule 0   No facility-administered medications prior to visit.    ROS Review of Systems  Constitutional:  Positive for unexpected weight change (wt gain). Negative for appetite change, chills, diaphoresis, fatigue and fever.  HENT: Negative.  Negative for sinus pressure and trouble swallowing.   Eyes: Negative.   Respiratory:  Positive for apnea and shortness of breath (DOE). Negative for cough, chest tightness and wheezing.   Cardiovascular:  Negative for chest pain, palpitations and leg swelling.  Gastrointestinal:  Negative for abdominal pain, constipation, diarrhea, nausea and vomiting.  Endocrine: Negative.   Genitourinary: Negative.  Negative for difficulty urinating, dysuria and flank pain.  Musculoskeletal: Negative.  Negative for arthralgias and myalgias.  Skin: Negative.   Allergic/Immunologic: Positive for environmental allergies.  Neurological:  Positive for headaches. Negative for dizziness, seizures, speech difficulty, weakness, light-headedness and numbness.  Hematological:  Negative for adenopathy. Does not bruise/bleed  easily.  Psychiatric/Behavioral: Negative.      Objective:  BP 136/88 (BP Location: Left Arm, Patient Position: Sitting, Cuff Size: Large)   Pulse 81   Temp 98.3 F (36.8 C) (Oral)   Resp 16   Ht 5' 6 (1.676 m)   Wt (!) 309 lb 12.8 oz (140.5 kg)   SpO2 98%   BMI 50.00 kg/m   BP Readings from Last 3 Encounters:  10/04/23 136/88  04/01/23 138/88  11/02/22 (!) 131/91    Wt Readings from Last 3 Encounters:  10/04/23 (!) 309 lb 12.8 oz (140.5 kg)  04/01/23 270 lb (122.5 kg)  11/02/22 299 lb (135.6 kg)    Physical Exam Vitals reviewed.  Constitutional:      Appearance: Normal appearance.  HENT:     Nose: Nose normal.     Mouth/Throat:     Mouth: Mucous membranes are moist.  Eyes:     General: No scleral icterus.    Conjunctiva/sclera: Conjunctivae normal.  Cardiovascular:     Rate and Rhythm: Normal rate and regular rhythm.     Heart sounds: No murmur heard.    No friction rub. No gallop.     Comments: EKG---- NSR, 75 bpm No LVH, Q waves, or ST/T wave changes  Unchanged Pulmonary:     Effort: Pulmonary effort is normal.     Breath sounds: No stridor. No wheezing, rhonchi or rales.  Abdominal:     General: Abdomen is protuberant. Bowel sounds are normal. There is no distension.     Palpations: Abdomen is soft. There is no hepatomegaly, splenomegaly or mass.     Tenderness: There is no abdominal tenderness. There is no guarding.  Musculoskeletal:        General: Normal range of motion.     Cervical back: Neck supple.     Right lower leg: No edema.     Left lower leg: No edema.  Skin:    General: Skin is warm and dry.     Findings: No rash.  Neurological:     General: No focal deficit present.     Mental Status: He is alert. Mental status is at baseline.  Psychiatric:        Mood and Affect: Mood normal.        Behavior: Behavior normal.     Lab Results  Component Value Date   WBC 8.8 10/04/2023   HGB 14.3 10/04/2023   HCT 42.8 10/04/2023   PLT  324.0 10/04/2023   GLUCOSE 86 10/04/2023   CHOL 174 04/01/2023   TRIG 79.0 04/01/2023   HDL 41.80 04/01/2023   LDLCALC 117 (H) 04/01/2023   ALT 79 (H) 10/04/2023   AST 36 10/04/2023   NA 140 10/04/2023   K 4.0 10/04/2023   CL 102 10/04/2023   CREATININE 0.90 10/04/2023   BUN 15 10/04/2023   CO2 30 10/04/2023   TSH 3.46 04/01/2023   PSA 0.84 10/04/2023   INR 1.2 (H) 04/01/2023   HGBA1C 5.6 04/01/2023    DG Foot Complete Right Result Date: 11/23/2017 Please see detailed radiograph report  in office note.  DG Foot Complete Left Result Date: 11/23/2017 Please see detailed radiograph report in office note.  US  Abdomen Limited RUQ Result Date: 11/22/2017 CLINICAL DATA:  Elevated liver enzymes EXAM: ULTRASOUND ABDOMEN LIMITED RIGHT UPPER QUADRANT COMPARISON:  None. FINDINGS: Gallbladder: No gallstones or wall thickening visualized. There is no pericholecystic fluid. No sonographic Murphy sign noted by sonographer. Common bile duct: Diameter: 3 mm. No intrahepatic or extrahepatic biliary duct dilatation. Liver: No focal lesion identified. Liver echogenicity is increased diffusely. Portal vein is patent on color Doppler imaging with normal direction of blood flow towards the liver. IMPRESSION: Diffuse increase in liver echogenicity, a finding felt to be indicative of hepatic steatosis. While no focal liver lesions are evident on this study, it must be cautioned that the sensitivity of ultrasound for detection of focal liver lesions is diminished in this circumstance. Study otherwise unremarkable. Electronically Signed   By: Elsie Repine III M.D.   On: 11/22/2017 11:46    Fibrosis 4 Score = .55 (Low risk)        Interpretation for patients with NAFLD          <1.30       -  F0-F1 (Low risk)          1.30-2.67 -  Indeterminate           >2.67      -  F3-F4 (High risk)     Validated for ages 49-65         Assessment & Plan:   Essential hypertension- BP is well controlled. EKG is  negative for LVH. Lytes are normal. -     Basic metabolic panel with GFR; Future -     CBC with Differential/Platelet; Future -     Hepatic function panel; Future -     EKG 12-Lead -     Irbesartan ; Take 1 tablet (300 mg total) by mouth daily.  Dispense: 90 tablet; Refill: 1  Nonalcoholic steatohepatitis (NASH) -     Hepatic function panel; Future  Need for immunization against influenza -     Flu vaccine trivalent PF, 6mos and older(Flulaval,Afluria,Fluarix,Fluzone)  Encounter for general adult medical examination with abnormal findings- Exam completed, labs reviewed, vaccines reviewed and updated, cancer screenings addressed, pt ed material was given.   OSA on CPAP -     Zepbound ; Inject 2.5 mg into the skin once a week.  Dispense: 4 mL; Refill: 0  Family history of prostate cancer in father -     PSA; Future  Mild intermittent asthma without complication -     Airsupra ; Inhale 2 puffs into the lungs as needed.  Dispense: 32.1 g; Refill: 1 -     Budesonide -Formoterol  Fumarate; Inhale 2 puffs into the lungs 2 (two) times daily.  Dispense: 30.6 g; Refill: 1  Migraine with aura and without status migrainosus, not intractable -     Eletriptan  Hydrobromide; Take 1 tablet (40 mg total) by mouth every 2 (two) hours as needed for migraine or headache. May repeat in 2 hours if headache persists or recurs.  Dispense: 10 tablet; Refill: 3     Follow-up: Return in about 6 months (around 04/02/2024).  Debby Molt, MD

## 2023-10-04 NOTE — Patient Instructions (Signed)
 Health Maintenance, Male  Adopting a healthy lifestyle and getting preventive care are important in promoting health and wellness. Ask your health care provider about:  The right schedule for you to have regular tests and exams.  Things you can do on your own to prevent diseases and keep yourself healthy.  What should I know about diet, weight, and exercise?  Eat a healthy diet    Eat a diet that includes plenty of vegetables, fruits, low-fat dairy products, and lean protein.  Do not eat a lot of foods that are high in solid fats, added sugars, or sodium.  Maintain a healthy weight  Body mass index (BMI) is a measurement that can be used to identify possible weight problems. It estimates body fat based on height and weight. Your health care provider can help determine your BMI and help you achieve or maintain a healthy weight.  Get regular exercise  Get regular exercise. This is one of the most important things you can do for your health. Most adults should:  Exercise for at least 150 minutes each week. The exercise should increase your heart rate and make you sweat (moderate-intensity exercise).  Do strengthening exercises at least twice a week. This is in addition to the moderate-intensity exercise.  Spend less time sitting. Even light physical activity can be beneficial.  Watch cholesterol and blood lipids  Have your blood tested for lipids and cholesterol at 44 years of age, then have this test every 5 years.  You may need to have your cholesterol levels checked more often if:  Your lipid or cholesterol levels are high.  You are older than 44 years of age.  You are at high risk for heart disease.  What should I know about cancer screening?  Many types of cancers can be detected early and may often be prevented. Depending on your health history and family history, you may need to have cancer screening at various ages. This may include screening for:  Colorectal cancer.  Prostate cancer.  Skin cancer.  Lung  cancer.  What should I know about heart disease, diabetes, and high blood pressure?  Blood pressure and heart disease  High blood pressure causes heart disease and increases the risk of stroke. This is more likely to develop in people who have high blood pressure readings or are overweight.  Talk with your health care provider about your target blood pressure readings.  Have your blood pressure checked:  Every 3-5 years if you are 24-52 years of age.  Every year if you are 3 years old or older.  If you are between the ages of 60 and 72 and are a current or former smoker, ask your health care provider if you should have a one-time screening for abdominal aortic aneurysm (AAA).  Diabetes  Have regular diabetes screenings. This checks your fasting blood sugar level. Have the screening done:  Once every three years after age 66 if you are at a normal weight and have a low risk for diabetes.  More often and at a younger age if you are overweight or have a high risk for diabetes.  What should I know about preventing infection?  Hepatitis B  If you have a higher risk for hepatitis B, you should be screened for this virus. Talk with your health care provider to find out if you are at risk for hepatitis B infection.  Hepatitis C  Blood testing is recommended for:  Everyone born from [birth year] through 1966.  Anyone  with known risk factors for hepatitis C.  Sexually transmitted infections (STIs)  You should be screened each year for STIs, including gonorrhea and chlamydia, if:  You are sexually active and are younger than 44 years of age.  You are older than 44 years of age and your health care provider tells you that you are at risk for this type of infection.  Your sexual activity has changed since you were last screened, and you are at increased risk for chlamydia or gonorrhea. Ask your health care provider if you are at risk.  Ask your health care provider about whether you are at high risk for HIV. Your health care provider  may recommend a prescription medicine to help prevent HIV infection. If you choose to take medicine to prevent HIV, you should first get tested for HIV. You should then be tested every 3 months for as long as you are taking the medicine.  Follow these instructions at home:  Alcohol use  Do not drink alcohol if your health care provider tells you not to drink.  If you drink alcohol:  Limit how much you have to 0-2 drinks a day.  Know how much alcohol is in your drink. In the [place] one drink equals one 12 oz bottle of beer (355 mL), one 5 oz glass of wine (148 mL), or one 1 oz glass of hard liquor (44 mL).  Lifestyle  Do not use any products that contain nicotine or tobacco. These products include cigarettes, chewing tobacco, and vaping devices, such as e-cigarettes. If you need help quitting, ask your health care provider.  Do not use street drugs.  Do not share needles.  Ask your health care provider for help if you need support or information about quitting drugs.  General instructions  Schedule regular health, dental, and eye exams.  Stay current with your vaccines.  Tell your health care provider if:  You often feel depressed.  You have ever been abused or do not feel safe at home.  Summary  Adopting a healthy lifestyle and getting preventive care are important in promoting health and wellness.  Follow your health care provider's instructions about healthy diet, exercising, and getting tested or screened for diseases.  Follow your health care provider's instructions on monitoring your cholesterol and blood pressure.  This information is not intended to replace advice given to you by your health care provider. Make sure you discuss any questions you have with your health care provider.  Document Revised: 05/13/2020 Document Reviewed: 05/13/2020  Elsevier Patient Education  2024 [employer].

## 2023-10-05 ENCOUNTER — Telehealth: Payer: Self-pay

## 2023-10-05 ENCOUNTER — Other Ambulatory Visit (HOSPITAL_COMMUNITY): Payer: Self-pay

## 2023-10-05 ENCOUNTER — Other Ambulatory Visit (HOSPITAL_BASED_OUTPATIENT_CLINIC_OR_DEPARTMENT_OTHER): Payer: Self-pay

## 2023-10-05 ENCOUNTER — Other Ambulatory Visit: Payer: Self-pay

## 2023-10-05 ENCOUNTER — Ambulatory Visit: Payer: Self-pay | Admitting: Internal Medicine

## 2023-10-05 MED ORDER — UBRELVY 100 MG PO TABS
1.0000 | ORAL_TABLET | Freq: Once | ORAL | 5 refills | Status: AC | PRN
  Filled 2023-10-05: qty 8, 30d supply, fill #0

## 2023-10-05 NOTE — Telephone Encounter (Signed)
 Pharmacy Patient Advocate Encounter   Received notification from CoverMyMeds that prior authorization for Ubrelvy 100MG  tablets  is required/requested.   Insurance verification completed.   The patient is insured through [facility].   Per test claim: PA required; PA submitted to above mentioned insurance via Latent Key/confirmation #/EOC [id number] Status is pending

## 2023-10-05 NOTE — Addendum Note (Signed)
 Addended by: JOSHUA DEBBY CROME on: 10/05/2023 11:53 AM   Modules accepted: Orders

## 2023-10-08 NOTE — Telephone Encounter (Signed)
 Pharmacy Patient Advocate Encounter  Received notification from [facility] that Prior Authorization for Ubrelvy 100MG  tablets  has been APPROVED from 10/08/2023 to 04/06/2024   PA #/Case ID/Reference #: [id number]

## 2023-10-11 NOTE — Telephone Encounter (Signed)
 Patient has been made aware of his approval via voice message.

## 2023-11-01 ENCOUNTER — Other Ambulatory Visit (HOSPITAL_COMMUNITY): Payer: Self-pay

## 2023-11-02 ENCOUNTER — Other Ambulatory Visit (HOSPITAL_COMMUNITY): Payer: Self-pay

## 2023-11-04 ENCOUNTER — Encounter: Payer: Self-pay | Admitting: Internal Medicine

## 2023-11-05 ENCOUNTER — Other Ambulatory Visit: Payer: Self-pay

## 2023-11-05 DIAGNOSIS — G4733 Obstructive sleep apnea (adult) (pediatric): Secondary | ICD-10-CM

## 2023-11-06 ENCOUNTER — Other Ambulatory Visit: Payer: Self-pay | Admitting: Internal Medicine

## 2023-11-06 DIAGNOSIS — G4733 Obstructive sleep apnea (adult) (pediatric): Secondary | ICD-10-CM

## 2023-11-06 DIAGNOSIS — E66813 Obesity, class 3: Secondary | ICD-10-CM

## 2023-11-06 MED ORDER — TIRZEPATIDE-WEIGHT MANAGEMENT 5 MG/0.5ML ~~LOC~~ SOLN: 5.0000 mg | SUBCUTANEOUS | 0 refills | Status: DC

## 2023-11-12 ENCOUNTER — Other Ambulatory Visit: Payer: Self-pay | Admitting: Internal Medicine

## 2023-11-12 DIAGNOSIS — E66813 Obesity, class 3: Secondary | ICD-10-CM

## 2023-11-12 DIAGNOSIS — G4733 Obstructive sleep apnea (adult) (pediatric): Secondary | ICD-10-CM

## 2023-11-12 MED ORDER — TIRZEPATIDE-WEIGHT MANAGEMENT 2.5 MG/0.5ML ~~LOC~~ SOLN: 2.5000 mg | SUBCUTANEOUS | 0 refills | Status: DC

## 2023-11-29 ENCOUNTER — Other Ambulatory Visit (HOSPITAL_COMMUNITY): Payer: Self-pay

## 2023-11-29 MED ORDER — BUPROPION HCL ER (XL) 300 MG PO TB24
300.0000 mg | ORAL_TABLET | Freq: Every day | ORAL | 0 refills | Status: DC
  Filled 2023-11-29: qty 90, 90d supply, fill #0

## 2023-12-01 ENCOUNTER — Other Ambulatory Visit: Payer: Self-pay | Admitting: Internal Medicine

## 2023-12-01 DIAGNOSIS — Z6841 Body Mass Index (BMI) 40.0 and over, adult: Secondary | ICD-10-CM

## 2023-12-01 DIAGNOSIS — G4733 Obstructive sleep apnea (adult) (pediatric): Secondary | ICD-10-CM

## 2023-12-04 ENCOUNTER — Encounter: Payer: Self-pay | Admitting: Internal Medicine

## 2023-12-24 ENCOUNTER — Other Ambulatory Visit: Payer: Self-pay | Admitting: Internal Medicine

## 2023-12-24 DIAGNOSIS — E66813 Obesity, class 3: Secondary | ICD-10-CM

## 2023-12-24 DIAGNOSIS — G4733 Obstructive sleep apnea (adult) (pediatric): Secondary | ICD-10-CM

## 2023-12-27 ENCOUNTER — Other Ambulatory Visit (HOSPITAL_COMMUNITY): Payer: Self-pay

## 2023-12-27 DIAGNOSIS — F411 Generalized anxiety disorder: Secondary | ICD-10-CM | POA: Diagnosis not present

## 2023-12-27 DIAGNOSIS — F331 Major depressive disorder, recurrent, moderate: Secondary | ICD-10-CM | POA: Diagnosis not present

## 2023-12-27 MED ORDER — VENLAFAXINE HCL ER 37.5 MG PO CP24
37.5000 mg | ORAL_CAPSULE | Freq: Every day | ORAL | 0 refills | Status: AC
  Filled 2023-12-27: qty 90, 90d supply, fill #0

## 2023-12-27 MED ORDER — BUPROPION HCL ER (XL) 300 MG PO TB24: 300.0000 mg | ORAL_TABLET | Freq: Every day | ORAL | 0 refills | Status: AC

## 2023-12-27 MED ORDER — VENLAFAXINE HCL ER 75 MG PO CP24
75.0000 mg | ORAL_CAPSULE | Freq: Every morning | ORAL | 0 refills | Status: AC
  Filled 2024-02-01: qty 90, 90d supply, fill #0

## 2024-01-07 ENCOUNTER — Other Ambulatory Visit: Payer: Self-pay | Admitting: Internal Medicine

## 2024-01-07 DIAGNOSIS — G4733 Obstructive sleep apnea (adult) (pediatric): Secondary | ICD-10-CM

## 2024-01-07 MED ORDER — TIRZEPATIDE-WEIGHT MANAGEMENT 7.5 MG/0.5ML ~~LOC~~ SOLN: 7.5000 mg | SUBCUTANEOUS | 0 refills | Status: DC

## 2024-01-16 ENCOUNTER — Other Ambulatory Visit: Payer: Self-pay | Admitting: Internal Medicine

## 2024-01-16 DIAGNOSIS — G4733 Obstructive sleep apnea (adult) (pediatric): Secondary | ICD-10-CM

## 2024-01-16 DIAGNOSIS — J4520 Mild intermittent asthma, uncomplicated: Secondary | ICD-10-CM

## 2024-01-16 MED ORDER — TIRZEPATIDE-WEIGHT MANAGEMENT 10 MG/0.5ML ~~LOC~~ SOLN: 10.0000 mg | SUBCUTANEOUS | 0 refills | Status: AC

## 2024-01-28 ENCOUNTER — Other Ambulatory Visit: Payer: Self-pay | Admitting: Internal Medicine

## 2024-01-28 DIAGNOSIS — G4733 Obstructive sleep apnea (adult) (pediatric): Secondary | ICD-10-CM

## 2024-02-02 ENCOUNTER — Encounter (HOSPITAL_COMMUNITY): Payer: Self-pay

## 2024-02-02 ENCOUNTER — Other Ambulatory Visit (HOSPITAL_COMMUNITY): Payer: Self-pay

## 2024-02-04 ENCOUNTER — Encounter: Payer: Self-pay | Admitting: Internal Medicine

## 2024-02-08 ENCOUNTER — Other Ambulatory Visit: Payer: Self-pay | Admitting: Internal Medicine
